# Patient Record
Sex: Female | Born: 1975 | Race: Black or African American | Hispanic: No | Marital: Married | Smoking: Never smoker
Health system: Southern US, Community
[De-identification: ages and names within clinical notes are randomized; demographics above are authoritative.]

## PROBLEM LIST (undated history)

## (undated) DIAGNOSIS — D649 Anemia, unspecified: Secondary | ICD-10-CM

## (undated) DIAGNOSIS — T7840XA Allergy, unspecified, initial encounter: Secondary | ICD-10-CM

## (undated) DIAGNOSIS — D219 Benign neoplasm of connective and other soft tissue, unspecified: Secondary | ICD-10-CM

## (undated) DIAGNOSIS — M1990 Unspecified osteoarthritis, unspecified site: Secondary | ICD-10-CM

## (undated) DIAGNOSIS — I10 Essential (primary) hypertension: Secondary | ICD-10-CM

## (undated) DIAGNOSIS — E119 Type 2 diabetes mellitus without complications: Secondary | ICD-10-CM

## (undated) HISTORY — PX: BREAST LUMPECTOMY: SHX2

## (undated) HISTORY — DX: Allergy, unspecified, initial encounter: T78.40XA

## (undated) HISTORY — PX: WISDOM TOOTH EXTRACTION: SHX21

## (undated) HISTORY — DX: Anemia, unspecified: D64.9

---

## 1998-08-24 ENCOUNTER — Encounter: Payer: Self-pay | Admitting: *Deleted

## 1998-08-24 ENCOUNTER — Ambulatory Visit (HOSPITAL_COMMUNITY): Admission: RE | Admit: 1998-08-24 | Discharge: 1998-08-24 | Payer: Self-pay | Admitting: *Deleted

## 1999-02-20 ENCOUNTER — Emergency Department (HOSPITAL_COMMUNITY): Admission: EM | Admit: 1999-02-20 | Discharge: 1999-02-20 | Payer: Self-pay | Admitting: Emergency Medicine

## 1999-04-14 ENCOUNTER — Ambulatory Visit (HOSPITAL_COMMUNITY): Admission: RE | Admit: 1999-04-14 | Discharge: 1999-04-14 | Payer: Self-pay | Admitting: *Deleted

## 1999-04-14 ENCOUNTER — Encounter: Payer: Self-pay | Admitting: *Deleted

## 2000-06-27 ENCOUNTER — Other Ambulatory Visit: Admission: RE | Admit: 2000-06-27 | Discharge: 2000-06-27 | Payer: Self-pay | Admitting: Obstetrics and Gynecology

## 2001-06-03 ENCOUNTER — Emergency Department (HOSPITAL_COMMUNITY): Admission: EM | Admit: 2001-06-03 | Discharge: 2001-06-04 | Payer: Self-pay | Admitting: Emergency Medicine

## 2002-03-25 ENCOUNTER — Other Ambulatory Visit: Admission: RE | Admit: 2002-03-25 | Discharge: 2002-03-25 | Payer: Self-pay | Admitting: Obstetrics and Gynecology

## 2003-09-08 ENCOUNTER — Other Ambulatory Visit: Admission: RE | Admit: 2003-09-08 | Discharge: 2003-09-08 | Payer: Self-pay | Admitting: Obstetrics and Gynecology

## 2005-03-21 ENCOUNTER — Emergency Department (HOSPITAL_COMMUNITY): Admission: EM | Admit: 2005-03-21 | Discharge: 2005-03-21 | Payer: Self-pay | Admitting: Family Medicine

## 2008-07-13 ENCOUNTER — Inpatient Hospital Stay (HOSPITAL_COMMUNITY): Admission: AD | Admit: 2008-07-13 | Discharge: 2008-07-13 | Payer: Self-pay | Admitting: Obstetrics & Gynecology

## 2008-07-27 ENCOUNTER — Inpatient Hospital Stay (HOSPITAL_COMMUNITY): Admission: AD | Admit: 2008-07-27 | Discharge: 2008-08-05 | Payer: Self-pay | Admitting: Obstetrics

## 2008-07-27 ENCOUNTER — Encounter: Payer: Self-pay | Admitting: Obstetrics

## 2008-07-28 ENCOUNTER — Encounter: Payer: Self-pay | Admitting: Obstetrics

## 2008-07-29 ENCOUNTER — Encounter: Payer: Self-pay | Admitting: Obstetrics

## 2008-07-30 ENCOUNTER — Encounter: Payer: Self-pay | Admitting: Obstetrics & Gynecology

## 2009-12-10 ENCOUNTER — Inpatient Hospital Stay (HOSPITAL_COMMUNITY): Admission: AD | Admit: 2009-12-10 | Discharge: 2009-12-10 | Payer: Self-pay | Admitting: Obstetrics & Gynecology

## 2009-12-10 ENCOUNTER — Ambulatory Visit: Payer: Self-pay | Admitting: Obstetrics & Gynecology

## 2010-02-21 ENCOUNTER — Encounter: Payer: Self-pay | Admitting: Obstetrics

## 2010-03-23 ENCOUNTER — Encounter: Payer: BC Managed Care – PPO | Attending: Obstetrics and Gynecology

## 2010-03-23 DIAGNOSIS — O99810 Abnormal glucose complicating pregnancy: Secondary | ICD-10-CM | POA: Insufficient documentation

## 2010-03-23 DIAGNOSIS — Z713 Dietary counseling and surveillance: Secondary | ICD-10-CM | POA: Insufficient documentation

## 2010-04-12 LAB — CBC
MCH: 31 pg (ref 26.0–34.0)
Platelets: 273 10*3/uL (ref 150–400)
RBC: 3.47 MIL/uL — ABNORMAL LOW (ref 3.87–5.11)
WBC: 13.2 10*3/uL — ABNORMAL HIGH (ref 4.0–10.5)

## 2010-04-12 LAB — URINE CULTURE: Colony Count: 50000

## 2010-04-12 LAB — URINALYSIS, ROUTINE W REFLEX MICROSCOPIC
Bilirubin Urine: NEGATIVE
Ketones, ur: 15 mg/dL — AB
Nitrite: NEGATIVE
Protein, ur: NEGATIVE mg/dL
Specific Gravity, Urine: >1.03 — ABNORMAL HIGH (ref 1.005–1.030)
Urobilinogen, UA: 0.2 mg/dL (ref 0.0–1.0)

## 2010-04-12 LAB — POCT PREGNANCY, URINE: Preg Test, Ur: POSITIVE

## 2010-04-12 LAB — WET PREP, GENITAL: Yeast Wet Prep HPF POC: NONE SEEN

## 2010-04-12 LAB — GC/CHLAMYDIA PROBE AMP, GENITAL
Chlamydia, DNA Probe: NEGATIVE
GC Probe Amp, Genital: NEGATIVE

## 2010-04-12 LAB — HCG, QUANTITATIVE, PREGNANCY: hCG, Beta Chain, Quant, S: 38230 m[IU]/mL — ABNORMAL HIGH (ref <5)

## 2010-04-12 LAB — ABO/RH: ABO/RH(D): B POS

## 2010-05-08 LAB — SAMPLE TO BLOOD BANK

## 2010-05-08 LAB — CBC
MCHC: 35.2 g/dL (ref 30.0–36.0)
RBC: 2.97 MIL/uL — ABNORMAL LOW (ref 3.87–5.11)
RDW: 14.4 % (ref 11.5–15.5)

## 2010-05-09 LAB — CBC
HCT: 32.5 % — ABNORMAL LOW (ref 36.0–46.0)
HCT: 31.1 % — ABNORMAL LOW (ref 36.0–46.0)
Hemoglobin: 10.6 g/dL — ABNORMAL LOW (ref 12.0–15.0)
Hemoglobin: 10.9 g/dL — ABNORMAL LOW (ref 12.0–15.0)
MCHC: 34.8 g/dL (ref 30.0–36.0)
MCV: 99.7 fL (ref 78.0–100.0)
MCV: 99.5 fL (ref 78.0–100.0)
Platelets: 201 10*3/uL (ref 150–400)
Platelets: 216 10*3/uL (ref 150–400)
Platelets: 236 10*3/uL (ref 150–400)
RDW: 14.2 % (ref 11.5–15.5)
RDW: 14.4 % (ref 11.5–15.5)
RDW: 14.2 % (ref 11.5–15.5)

## 2010-05-09 LAB — COMPREHENSIVE METABOLIC PANEL
ALT: 18 U/L (ref 0–35)
AST: 28 U/L (ref 0–37)
Albumin: 2.6 g/dL — ABNORMAL LOW (ref 3.5–5.2)
Albumin: 2.7 g/dL — ABNORMAL LOW (ref 3.5–5.2)
Albumin: 2.7 g/dL — ABNORMAL LOW (ref 3.5–5.2)
Alkaline Phosphatase: 115 U/L (ref 39–117)
BUN: 7 mg/dL (ref 6–23)
BUN: 3 mg/dL — ABNORMAL LOW (ref 6–23)
Calcium: 8.7 mg/dL (ref 8.4–10.5)
Chloride: 109 mEq/L (ref 96–112)
Creatinine, Ser: 0.5 mg/dL (ref 0.4–1.2)
Creatinine, Ser: 0.5 mg/dL (ref 0.4–1.2)
Glucose, Bld: 120 mg/dL — ABNORMAL HIGH (ref 70–99)
Glucose, Bld: 133 mg/dL — ABNORMAL HIGH (ref 70–99)
Potassium: 3.6 mEq/L (ref 3.5–5.1)
Sodium: 133 mEq/L — ABNORMAL LOW (ref 135–145)
Total Bilirubin: 0.6 mg/dL (ref 0.3–1.2)
Total Protein: 6.2 g/dL (ref 6.0–8.3)
Total Protein: 6.4 g/dL (ref 6.0–8.3)
Total Protein: 6.4 g/dL (ref 6.0–8.3)

## 2010-05-09 LAB — LACTATE DEHYDROGENASE
LDH: 121 U/L (ref 94–250)
LDH: 128 U/L (ref 94–250)

## 2010-05-09 LAB — URINALYSIS, ROUTINE W REFLEX MICROSCOPIC
Ketones, ur: NEGATIVE mg/dL
Nitrite: NEGATIVE
Urobilinogen, UA: 0.2 mg/dL (ref 0.0–1.0)
pH: 7 (ref 5.0–8.0)

## 2010-05-09 LAB — DIFFERENTIAL
Basophils Relative: 0 % (ref 0–1)
Eosinophils Absolute: 0.1 10*3/uL (ref 0.0–0.7)
Monocytes Absolute: 0.5 10*3/uL (ref 0.1–1.0)
Monocytes Relative: 4 % (ref 3–12)

## 2010-05-09 LAB — URINE MICROSCOPIC-ADD ON

## 2010-05-09 LAB — URIC ACID
Uric Acid, Serum: 5.5 mg/dL (ref 2.4–7.0)
Uric Acid, Serum: 5 mg/dL (ref 2.4–7.0)

## 2010-05-30 ENCOUNTER — Encounter (HOSPITAL_COMMUNITY)
Admission: RE | Admit: 2010-05-30 | Discharge: 2010-05-30 | Disposition: A | Discharge status: Home / Self Care | Payer: BC Managed Care – PPO | Source: Ambulatory Visit | Attending: Obstetrics and Gynecology | Admitting: Obstetrics and Gynecology

## 2010-05-30 LAB — SURGICAL PCR SCREEN: MRSA, PCR: NEGATIVE

## 2010-05-30 LAB — CBC
HCT: 31.5 % — ABNORMAL LOW (ref 36.0–46.0)
Hemoglobin: 10.6 g/dL — ABNORMAL LOW (ref 12.0–15.0)
MCHC: 33.7 g/dL (ref 30.0–36.0)
WBC: 10.9 10*3/uL — ABNORMAL HIGH (ref 4.0–10.5)

## 2010-05-30 LAB — BASIC METABOLIC PANEL
CO2: 20 mEq/L (ref 19–32)
Calcium: 8.8 mg/dL (ref 8.4–10.5)
GFR calc Af Amer: >60 mL/min (ref >60)
GFR calc non Af Amer: >60 mL/min (ref >60)
Glucose, Bld: 81 mg/dL (ref 70–99)
Potassium: 3.4 mEq/L — ABNORMAL LOW (ref 3.5–5.1)
Sodium: 138 mEq/L (ref 135–145)

## 2010-06-06 ENCOUNTER — Inpatient Hospital Stay (HOSPITAL_COMMUNITY)
Admission: AD | Admit: 2010-06-06 | Discharge: 2010-06-10 | DRG: 371 | Disposition: A | Discharge status: Home / Self Care | Payer: BC Managed Care – PPO | Source: Ambulatory Visit | Attending: Obstetrics and Gynecology | Admitting: Obstetrics and Gynecology

## 2010-06-06 DIAGNOSIS — O34599 Maternal care for other abnormalities of gravid uterus, unspecified trimester: Secondary | ICD-10-CM | POA: Diagnosis present

## 2010-06-06 DIAGNOSIS — Z01818 Encounter for other preprocedural examination: Secondary | ICD-10-CM

## 2010-06-06 DIAGNOSIS — D4959 Neoplasm of unspecified behavior of other genitourinary organ: Secondary | ICD-10-CM | POA: Diagnosis present

## 2010-06-06 DIAGNOSIS — Z01812 Encounter for preprocedural laboratory examination: Secondary | ICD-10-CM

## 2010-06-06 DIAGNOSIS — O34219 Maternal care for unspecified type scar from previous cesarean delivery: Principal | ICD-10-CM | POA: Diagnosis present

## 2010-06-06 DIAGNOSIS — O99814 Abnormal glucose complicating childbirth: Secondary | ICD-10-CM | POA: Diagnosis present

## 2010-06-06 DIAGNOSIS — D251 Intramural leiomyoma of uterus: Secondary | ICD-10-CM | POA: Diagnosis present

## 2010-06-06 LAB — TYPE AND SCREEN: Antibody Screen: NEGATIVE

## 2010-06-06 LAB — GLUCOSE, CAPILLARY

## 2010-06-07 LAB — CBC
HCT: 27 % — ABNORMAL LOW (ref 36.0–46.0)
Hemoglobin: 8.8 g/dL — ABNORMAL LOW (ref 12.0–15.0)
RDW: 15.3 % (ref 11.5–15.5)
WBC: 11 10*3/uL — ABNORMAL HIGH (ref 4.0–10.5)

## 2010-06-08 LAB — GLUCOSE, CAPILLARY: Glucose-Capillary: 104 mg/dL — ABNORMAL HIGH (ref 70–99)

## 2010-06-09 LAB — CBC
MCV: 93 fL (ref 78.0–100.0)
Platelets: 216 10*3/uL (ref 150–400)
RBC: 2.87 MIL/uL — ABNORMAL LOW (ref 3.87–5.11)
WBC: 8.1 10*3/uL (ref 4.0–10.5)

## 2010-06-09 LAB — COMPREHENSIVE METABOLIC PANEL
ALT: 13 U/L (ref 0–35)
AST: 19 U/L (ref 0–37)
Albumin: 2.4 g/dL — ABNORMAL LOW (ref 3.5–5.2)
Alkaline Phosphatase: 93 U/L (ref 39–117)
BUN: 6 mg/dL (ref 6–23)
Chloride: 104 mEq/L (ref 96–112)
Potassium: 3.7 mEq/L (ref 3.5–5.1)
Sodium: 137 mEq/L (ref 135–145)
Total Bilirubin: 0.3 mg/dL (ref 0.3–1.2)

## 2010-06-09 LAB — URIC ACID: Uric Acid, Serum: 4.7 mg/dL (ref 2.4–7.0)

## 2010-06-11 ENCOUNTER — Inpatient Hospital Stay (HOSPITAL_COMMUNITY)
Admission: AD | Admit: 2010-06-11 | Payer: BC Managed Care – PPO | Source: Ambulatory Visit | Admitting: Obstetrics & Gynecology

## 2010-06-14 NOTE — Op Note (Signed)
Sierra King, Sierra King            ACCOUNT NO.:  [id number]   MEDICAL RECORD NO.:  [id number]          PATIENT TYPE:  OUT   LOCATION:  MFM                           [facility]  [facility]   PHYSICIAN:  Roseanna Rainbow, M.D.DATE OF BIRTH:  [birth year]   DATE OF PROCEDURE:  07/30/2008  DATE OF DISCHARGE:  07/30/2008                               OPERATIVE REPORT   PREOPERATIVE DIAGNOSES:  1. Intrauterine pregnancy at 33 plus weeks.  2. Pregnancy-induced hypertension.  3. Intrauterine growth restriction.  4. Suspicious fetal heart tracing with decelerations.   POSTOPERATIVE DIAGNOSES:  1. Intrauterine pregnancy at 33 plus weeks.  2. Pregnancy-induced hypertension.  3. Intrauterine growth restriction.  4. Suspicious fetal heart tracing with decelerations.   PROCEDURE:  Repeat cesarean delivery via transverse skin incision.   SURGEON:  Roseanna Rainbow, MD   ANESTHESIA:  Spinal.   FINDINGS:  Live born female, Apgars 6 at 1 minute and 8 at 5 minutes,  cephalic presentation, umbilical artery pH 7.29.  There was a true knot  in the umbilical cord and a loose body cord.   PATHOLOGY:  Placenta.   ESTIMATED BLOOD LOSS:  600 mL.   COMPLICATIONS:  None.   PROCEDURE:  This patient was taken to the operating room with an IV  running.  Spinal anesthetic was administered.  She was then placed in  the dorsal supine position with a leftward tilt.  She was prepped and  draped in the usual sterile fashion.  After a time-out had been  completed, the previous transverse scar was then incised with a scalpel,  and the incision was carried down to the underlying fascia.  The fascia  was incised along the length of the incision.  The superior aspect of  the fascial incision was tented up and the underlying rectus muscles  dissected off.  The inferior aspect of the fascial incision was  manipulated in a similar fashion.  The rectus muscles were separated in  the midline.  The parietal  peritoneum was tented up and entered sharply.  This incision was then extended superiorly and inferiorly with good  vision visualization of the bladder.  The bladder blade was then placed.  The vesicouterine peritoneum was tented up and entered sharply.  This  incision was then extended bilaterally, and a bladder flap created  sharply.  The bladder blade was then replaced.  The lower uterine  segment was incised in a transverse fashion with a scalpel.  The  incision was then extended bluntly.  An attempt was made to deliver the  infant en caul.  The infant's head was delivered atraumatically.  The  cord was clamped and cut.  The infant was handed off to the awaiting  neonatologist.  The placenta was then removed.  Please note that an  umbilical artery pH was sent.  The intrauterine cavity was then  evacuated of any remaining amniotic fluid clots and debris with  moistened laparotomy sponge.  The uterine incision was then  reapproximated in a running interlocking fashion using a suture of 0  Monocryl.  A second imbricating layer of the same suture  was placed.  Adequate hemostasis was noted.  The paracolic gutters were then  irrigated.  The parietal peritoneum was then reapproximated in a running  fashion using a suture of 2-0 Vicryl.  The fascia was reapproximated  with 2 running sutures of 0 PDS tied in the midline.  The skin was  closed in a subcuticular fashion using a suture of 3-0 Monocryl.  At the  close of the procedure, the instrument and pack counts were said to be  correct x2.  The patient was taken to the PACU awake and in stable  condition.      Roseanna Rainbow, M.D.  Electronically Signed     LAJ/MEDQ  D:  07/30/2008  T:  07/31/2008  Job:  [id number]

## 2010-06-17 NOTE — Discharge Summary (Signed)
Sierra King, Sierra King            ACCOUNT NO.:  [id number]   MEDICAL RECORD NO.:  [id number]          PATIENT TYPE:  INP   LOCATION:  9303                          [facility]:  [facility]   PHYSICIAN:  Roseanna Rainbow, M.D.DATE OF BIRTH:  March 17, 1975   DATE OF ADMISSION:  07/27/2008  DATE OF DISCHARGE:  08/05/2008                               DISCHARGE SUMMARY   CHIEF COMPLAINT:  The patient is a 35 year old, gravida 4, para 1 with  an estimated date of confinement of September 03, 2008, with elevated blood  pressures.   HISTORY OF PRESENT ILLNESS:  The patient has a history of elevated blood  pressures over the last several weeks.  The patient was recently started  on Aldomet.  She was found to have a blood pressure of 160/110 at a  routine office visit on the day of presentation.   PAST SURGICAL HISTORY:  Breast lumpectomy, oral surgery, and cesarean  delivery.   PAST MEDICAL HISTORY:  Osteoarthritis, migraine headaches, and genital  herpes.   MEDICATIONS:  Prenatal vitamins.   ALLERGIES:  No known drug allergies.   SOCIAL HISTORY:  She is married.  She denies any tobacco, ethanol, or  drug use.   FAMILY HISTORY:  Diabetes mellitus and chronic hypertension.   REVIEW OF SYSTEMS:  NEUROLOGIC:  She denies any headaches or visual  disturbances.   PHYSICAL EXAMINATION:  VITAL SIGNS:  Temperature 98.5 and blood pressure  160/110.  LUNGS:  Clear to auscultation bilaterally.  HEART:  Regular rate and rhythm.  ABDOMEN:  Gravid and nontender.   ASSESSMENT AND PLAN:  Intrauterine pregnancy at 33 plus weeks with  pregnancy-induced hypertension.  Planned admission.  Heightened  surveillance.   HOSPITAL COURSE:  The patient was admitted.  Dr. Margot Ables at the [facility] was consulted.  An ultrasound showed an estimated fetal  weight at less than 10th percentile.  His recommendations included  performing a 24-hour urine for protein and creatinine, to complete a  course of  betamethasone, obtain serial PIH labs, perform serial  umbilical artery Doppler assessments, and biweekly fetal testing.  The  patient received a course of betamethasone.  A 24-hour urine showed a  total protein of 252 mg per day and a creatinine clearance of 196 mL per  minute.  On July 28, 2008, [facility] panel was normal.  On July 28, 2008, a  BPP was 8/8.  On July 29, 2008, until July 30, 2008, the fetal heart  tracing deteriorated with decreased long term variability and  spontaneous decelerations.  At this point, the magnesium sulfate was  initiated for seizure prophylaxis.  Intrauterine resuscitation efforts  were performed and the patient was brought for a cesarean delivery.  Please see the dictated operative summary.  Postoperatively, the patient  was admitted to the AICU and the magnesium sulfate seizure prophylaxis  was continued.  She had previously been on Aldomet for her blood  pressure control and Norvasc was added to this regimen.  On  postoperative day #1, the hemoglobin was 7.9 and a PIH labs panel was  normal.  She remained stable and on  postoperative day 2, the magnesium  sulfate was discontinued.  Subsequently, she developed labile blood  pressures and labetalol was added to her blood pressure regimen.  She  remained stable and was discharged to home on postoperative day #6.   DISCHARGE DIAGNOSIS:  Severe preeclampsia at 33 weeks, suspicious FHT  with spontaneous decelerations   PROCEDURE:  Cesarean delivery.   CONDITION:  Stable.   DIET:  Regular.   ACTIVITY:  Pelvic rest progressive activity.   MEDICATIONS:  1. Percocet 1-2 tablets every 6 hours as needed.  2. Norvasc 5 mg once per day.  3. Aldomet 500 mg p.o. t.i.d.   DISPOSITION:  The patient was to follow up in the office in several  days.      Roseanna Rainbow, M.D.  Electronically Signed     LAJ/MEDQ  D:  08/14/2008  T:  08/15/2008  Job:  [id number]

## 2010-06-18 NOTE — Op Note (Signed)
Sierra King, LEVAY            ACCOUNT NO.:  [id number]  MEDICAL RECORD NO.:  [id number]           PATIENT TYPE:  I  LOCATION:  9126                          [facility]  [facility]  PHYSICIAN:  Maxie Better, M.D.DATE OF BIRTH:  [birth year]  DATE OF PROCEDURE:  06/06/2010 DATE OF DISCHARGE:                              OPERATIVE REPORT   PREOPERATIVE DIAGNOSES:  Previous cesarean section x3, term gestation, class A2 gestational diabetes, uterine fibroids.  POSTOPERATIVE DIAGNOSES:  Previous cesarean section x3, term gestation, uterine fibroids, class A2 gestational diabetes.  PROCEDURES:  Repeat cesarean section, Kerr hysterotomy.  ANESTHESIA:  Spinal.  SURGEON:  Maxie Better, MD  ASSISTANT:  Marlinda Mike, CNM  PROCEDURE IN DETAILS:  Under adequate spinal anesthesia, the patient was placed in a supine position with a left lateral tilt.  She was sterilely prepped and draped in usual fashion and indwelling Foley catheter was sterilely placed.  Marcaine 0.25% was injected along the previous Pfannenstiel skin incision.  Pfannenstiel skin incision was then made, carried down to the rectus fascia.  Rectus fascia was opened transversely.  The rectus fascia was then bluntly and sharply dissected off the rectus muscle in superior and inferior fashion.  The rectus muscles was split in midline.  The parietal peritoneum was entered bluntly and extended.  On entering the abdominal cavity, a very thin lower uterine segment was encountered with a quarter size window noted on the right which was still covered by a very thin layer of the uterine serosa.  One incision was made resulting in the serosa of the uterus opening and extended with bandage scissors, the bladder which had been adherent to the lower uterine segment was not touched.  The incision was extended.  The artificial rupture of membranes was done.  Clear amniotic fluid was noted.  A floating vertex was noted and inability  to deliver the baby without assistance.  Vacuum was applied with subsequent delivery of a live female who was bulb suctioned in the abdomen.  Cord was clamped and cut.  The baby was transferred to the awaiting pediatrician who assigned Apgars of 9 and 9 at 1 and 5 minutes.  The patient's placenta was posterior, was spontaneously removed.  Uterine cavity was cleaned of debris.  Uterine incision had a small extension inferiorly about a centimeter in the midline.  That was closed separately with 0 Monocryl figure-of-eight sutures and the incision was then closed with 0 Monocryl running locked suture.  A second layer could not be placed due to risk again still send this below that lower uterine segment with inability to reinforce that incision without probable tearing through what tissue was already in place at that point.  A decision was then made with good hemostasis already present not to further try to do that.  The tubes and ovaries were noted to be normal bilaterally.  There was a right intramural cornual fibroid noted and 1 intramural fibroid.  The abdomen was copiously irrigated and suctioned of debris.  The parietal peritoneum was then closed with 2-0 Vicryl. The rectus fascia was closed with 0 Vicryl x2.  The subcutaneous area was irrigated.  Small  bleeders cauterized.  Interrupted 2-0 plain suture was placed and the skin approximated with Ethicon staples.  SPECIMENS:  Placenta not sent to Pathology.  Estimated blood loss 600 mL.  Intraoperative fluid 1900 mL.  Urine output 50 mL.  Sponge and instrument counts x2 was correct. Complications was none.  Weight of the baby was 7 pounds 4 ounces.  The patient tolerated the procedure well and was transferred to recovery room in stable condition.     Maxie Better, M.D.     [place]/MEDQ  D:  06/06/2010  T:  06/07/2010  Job:  [id number]  Electronically Signed by Nena Jordan Saaya Procell M.D. on 06/18/2010 06:47:15 PM

## 2010-06-18 NOTE — Discharge Summary (Addendum)
NAMEAVILA, ALBRITTON            ACCOUNT NO.:  [id number]  MEDICAL RECORD NO.:  [id number]           PATIENT TYPE:  I  LOCATION:  9126                          [facility]  PHYSICIAN:  Maxie Better, M.D.DATE OF BIRTH:  Nov 23, 1975  DATE OF ADMISSION:  06/06/2010 DATE OF DISCHARGE:  06/10/2010                              DISCHARGE SUMMARY   ADMITTING DIAGNOSES: 1. Term intrauterine pregnancy. 2. History of previous C-section. 3. History of uterine fibroids. 4. Gestational diabetes, class A2.  DISCHARGE DIAGNOSES: 1. Status post C-section and delivery of a viable female infant. 2. Resolved gestational diabetes. 3. Uterine fibroids. 4. Labile blood pressures with no evidence of preeclampsia. 5. Acute blood loss anemia.  The patient is a G5, P 2-1-2-2 at term gestation with an [facility] of Jun 11, 2010.  The patient has presented late to [facility] for prenatal care at 17 weeks with Dr. Cherly Hensen as primary provider.  PRENATAL LABORATORY DATA:  Rh positive, rubella immune, GBS positive, HIV nonreactive, RPR nonreactive, hepatitis B nonreactive, GTT abnormal at 180.  Prenatal course has been complicated by gestational diabetes class A2 and the patient has been on glyburide 2.5 mg p.o. b.i.d.  The patient also has medical history of obesity and previous pregnancy history to include preeclampsia.  ALLERGIES:  Known LATEX allergy.  CURRENT MEDICATIONS:  Iron supplement p.o. daily and prenatal vitamins 1 p.o. daily along with glyburide 2.5 mg b.i.d.  Preop labs were obtained on May 30, 2010.  WBC 10.9, hemoglobin 10.6, hematocrit 31.5, and platelets 225.  The patient was planned for C- section on Jun 06, 2010, due to previous C-section x3 and at term gestation along with gestational diabetes.  C-section was performed on Jun 06, 2010, by Dr. Cherly Hensen delivering a viable female infant weighing 7 pounds and 4 ounces and Apgars 9 and 9 at 1 and 5 minutes.  Infant  was transferred to [facility] in stable condition.  Postop course has been complicated by elevated blood pressures in the range of 160-170s/90- 101.  Lab values have been stable.  Uric acid 4.7 on Jun 09, 2010.  LDH 195 on Jun 09, 2010, and liver enzymes were within normal limits on Jun 09, 2010.  The patient has also been noted to be anemic.  Postoperative lab work also included on Jun 07, 2010.  WBC 11.0, hemoglobin 8.8, hematocrit 27.0, and platelets 212.  The patient was placed on labetalol 100 mg p.o. b.i.d. and was also supplemented with IV doses of labetalol, was also given hydrochlorothiazide 50 mg p.o. daily.  At the time of discharge, blood pressures had stabilized in the range of 150s-160s/90s without any symptoms of preeclampsia denying headache, changes in vision and epigastric pain.  Lower extremity edema has also improved and at the time of discharge has trace to +1 pedal edema with a negative Homans sign.  The patient is up in the room tolerating a regular diet and voiding without problems and positive for flatus.  Low-transverse incision is closed with staples.  There is a moderate amount of edema and due to repeat C-section status we will plan to remove staples on day #  7.  The patient has appointment in the office for Jun 13, 2010, at 11:00 a.m. for staple removal and blood pressure check.  The patient has been given a [employer] [facility] postpartum booklet.  Medications at the time of discharge include: 1. Ibuprofen 600 mg p.o. q.6 h. p.r.n. pain, dispensed #30, no refill. 2. Percocet 5/325 one to two p.o. q.6 h. p.r.n. pain, #30, no refill. 3. Labetalol 100 mg p.o. b.i.d., #60, no refill. 4. Hydrochlorothiazide 50 mg 1 p.o. daily x4 days, dispensed #4, no     refill. 5. Ferralet 90 one p.o. daily, #30. 6. Colace 100 mg p.o. b.i.d., #30.  The patient has also been advised to follow up for routine postpartum visit in 6 weeks.     Arlana Lindau,  NP   ______________________________ Maxie Better, M.D.    JF/MEDQ  D:  06/10/2010  T:  06/10/2010  Job:  [id number]  Electronically Signed by Nena Jordan COUSINS M.D. on 06/18/2010 06:45:52 PM Electronically Signed by Arlana Lindau  on 06/29/2010 05:04:21 PM

## 2012-01-10 ENCOUNTER — Other Ambulatory Visit: Payer: Self-pay | Admitting: Family Medicine

## 2012-01-10 ENCOUNTER — Ambulatory Visit
Admission: RE | Admit: 2012-01-10 | Discharge: 2012-01-10 | Disposition: A | Discharge status: Home / Self Care | Payer: BC Managed Care – PPO | Source: Ambulatory Visit | Attending: Family Medicine | Admitting: Family Medicine

## 2012-01-10 DIAGNOSIS — R109 Unspecified abdominal pain: Secondary | ICD-10-CM

## 2012-01-10 MED ORDER — IOHEXOL 300 MG/ML  SOLN
125.0000 mL | Freq: Once | INTRAMUSCULAR | Status: AC | PRN
  Administered 2012-01-10: 125 mL via INTRAVENOUS

## 2012-01-12 ENCOUNTER — Other Ambulatory Visit: Payer: BC Managed Care – PPO

## 2012-09-04 ENCOUNTER — Ambulatory Visit: Payer: BC Managed Care – PPO

## 2012-09-04 ENCOUNTER — Ambulatory Visit (INDEPENDENT_AMBULATORY_CARE_PROVIDER_SITE_OTHER): Payer: BC Managed Care – PPO | Admitting: Family Medicine

## 2012-09-04 VITALS — BP 98/64 | HR 87 | Temp 98.1°F | Resp 18 | Ht 61.0 in | Wt 213.0 lb

## 2012-09-04 DIAGNOSIS — S83412A Sprain of medial collateral ligament of left knee, initial encounter: Secondary | ICD-10-CM

## 2012-09-04 DIAGNOSIS — M25562 Pain in left knee: Secondary | ICD-10-CM

## 2012-09-04 DIAGNOSIS — M25569 Pain in unspecified knee: Secondary | ICD-10-CM

## 2012-09-04 DIAGNOSIS — S83419A Sprain of medial collateral ligament of unspecified knee, initial encounter: Secondary | ICD-10-CM

## 2012-09-04 NOTE — Patient Instructions (Signed)
I suspect you have an MCL sprain, but also possible meniscus tear.  An MRI would better determine this, but can try brace, exercises as in handout, ibuprofen or alleve over the counter if needed, and recheck in next 10-14 days.  Return to the clinic or go to the nearest emergency room if any of your symptoms worsen or new symptoms occur, as may need MRI or to see ortho.   Knee Sprain A knee sprain is a tear in one of the strong, fibrous tissues that connect the bones (ligaments) in your knee. The severity of the sprain depends on how much of the ligament is torn. The tear can be either partial or complete. CAUSES  Often, sprains are a result of a fall or injury. The force of the impact causes the fibers of your ligament to stretch too much. This excess tension causes the fibers of your ligament to tear. SYMPTOMS  You may have some loss of motion in your knee. Other symptoms include:  Bruising.  Tenderness.  Swelling. DIAGNOSIS  In order to diagnose knee sprain, your caregiver will physically examine your knee to determine how torn the ligament is. Your caregiver may also suggest an X-ray exam of your knee to make sure no bones are broken. TREATMENT  If your ligament is only partially torn, treatment usually involves keeping the knee in a fixed position (immobilization) or bracing your knee for activities that require movement for several weeks. To do this, your caregiver will apply a bandage, cast, or splint to keep your knee from moving or support your knee during movement until it heals. For a partially torn ligament, the healing process usually takes 4 to 6 weeks. If your ligament is completely torn, depending on which ligament it is, you may need surgery to reconnect the ligament to the bone or reconstruct it. After surgery, a cast or splint may be applied and will need to stay on your knee for 4 to 6 weeks while your ligament heals. HOME CARE INSTRUCTIONS  Keep your injured knee elevated to  decrease swelling.  To ease pain and swelling, apply ice to your knee twice a day, for 2 to 3 days:  Put ice in a plastic bag.  Place a towel between your skin and the bag.  Leave the ice on for 15 minutes.  Only take over-the-counter or prescription medicine for pain as directed by your caregiver.  Do not leave your knee unprotected until pain and stiffness go away (usually 4 to 6 weeks).  Do not allow your cast or splint to get wet. If you have been instructed not to remove it, cover your cast or splint with a plastic bag when you shower or bathe. Do not swim.  Your caregiver may suggest exercises for you to do during your recovery to prevent or limit permanent weakness and stiffness. SEEK IMMEDIATE MEDICAL CARE IF:  Your cast or splint becomes damaged.  Your pain becomes worse. MAKE SURE YOU:  Understand these instructions.  Will watch your condition.  Will get help right away if you are not doing well or get worse. Document Released: 01/16/2005 Document Revised: 04/10/2011 Document Reviewed: 12/31/2010 [facility] Patient Information 2014 [place], [place].

## 2012-09-04 NOTE — Progress Notes (Signed)
Subjective:    Patient ID: Sierra King, female    DOB: [birth year], 37 y.o.   MRN: [id number]  HPI Sierra King is a 37 y.o. female  Larey Seat down steps 2 weeks ago - had on slippers - R foot slid, L leg bent in - felt pop on inside of knee. Pain to WB initially - tx: ice, massage, few tylenol.  able to WB next day, applied ace wrap. Rom, trying to keep moving, but still sore on inside and feels popping and giving way sensation on inside of knee. No prior knee injury/surgery.   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 Nonsmoker.    Review of Systems  Musculoskeletal: Positive for joint swelling and arthralgias.  Skin: Negative for color change, rash and wound.       Objective:   Physical Exam  Vitals reviewed. Constitutional: She is oriented to person, place, and time. She appears well-nourished. No distress.  Overweight.   Pulmonary/Chest: Effort normal.  Musculoskeletal:       Left knee: She exhibits decreased range of motion (minimal - apporx 5-10 degree loss of terminal flexion. ), effusion (1+), abnormal meniscus (pain with mcmurray medially. ) and MCL laxity. She exhibits no erythema, no LCL laxity and normal patellar mobility. Tenderness found. Medial joint line and MCL tenderness noted. No patellar tendon tenderness noted.       Legs: Neurological: She is alert and oriented to person, place, and time.  nvi distally.     [facility] reading (PRIMARY) by  Dr. 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      Assessment & Plan:  Sierra King is a 37 y.o. female Knee pain, acute, left - Plan: DG Knee Complete 4 Views Left  Knee MCL sprain, left, initial encounter  Suspected L MCL tear with possible coexistent medial meniscus injury.  Options discussed incl. MRI or ortho eval vs trial of hinged knee brace  Will try hinged knee brace, otc alleve or ibuprofen, and HEP per sports medicine database handout, with  recheck in next 2 weeks. rtc precautions.   Patient Instructions  I suspect you have an MCL sprain, but also possible meniscus tear.  An MRI would better determine this, but can try brace, exercises as in handout, ibuprofen or alleve over the counter if needed, and recheck in next 10-14 days.  Return to the clinic or go to the nearest emergency room if any of your symptoms worsen or new symptoms occur, as may need MRI or to see ortho.   Knee Sprain A knee sprain is a tear in one of the strong, fibrous tissues that connect the bones (ligaments) in your knee. The severity of the sprain depends on how much of the ligament is torn. The tear can be either partial or complete. CAUSES  Often, sprains are a result of a fall or injury. The force of the impact causes the fibers of your ligament to stretch too much. This excess tension causes the fibers of your ligament to tear. SYMPTOMS  You may have some loss of motion in your knee. Other symptoms include:  Bruising.  Tenderness.  Swelling. DIAGNOSIS  In order to diagnose knee sprain, your caregiver will physically examine your knee to determine how torn the ligament is. Your caregiver may also suggest an X-ray exam of your knee to make sure no bones are broken. TREATMENT  If your ligament is only partially torn, treatment usually involves keeping  the knee in a fixed position (immobilization) or bracing your knee for activities that require movement for several weeks. To do this, your caregiver will apply a bandage, cast, or splint to keep your knee from moving or support your knee during movement until it heals. For a partially torn ligament, the healing process usually takes 4 to 6 weeks. If your ligament is completely torn, depending on which ligament it is, you may need surgery to reconnect the ligament to the bone or reconstruct it. After surgery, a cast or splint may be applied and will need to stay on your knee for 4 to 6 weeks while your ligament  heals. HOME CARE INSTRUCTIONS  Keep your injured knee elevated to decrease swelling.  To ease pain and swelling, apply ice to your knee twice a day, for 2 to 3 days:  Put ice in a plastic bag.  Place a towel between your skin and the bag.  Leave the ice on for 15 minutes.  Only take over-the-counter or prescription medicine for pain as directed by your caregiver.  Do not leave your knee unprotected until pain and stiffness go away (usually 4 to 6 weeks).  Do not allow your cast or splint to get wet. If you have been instructed not to remove it, cover your cast or splint with a plastic bag when you shower or bathe. Do not swim.  Your caregiver may suggest exercises for you to do during your recovery to prevent or limit permanent weakness and stiffness. SEEK IMMEDIATE MEDICAL CARE IF:  Your cast or splint becomes damaged.  Your pain becomes worse. MAKE SURE YOU:  Understand these instructions.  Will watch your condition.  Will get help right away if you are not doing well or get worse. Document Released: 01/16/2005 Document Revised: 04/10/2011 Document Reviewed: 12/31/2010 [facility] Patient Information 2014 [place], [place].

## 2016-05-15 ENCOUNTER — Other Ambulatory Visit: Payer: Self-pay | Admitting: Obstetrics and Gynecology

## 2016-05-15 DIAGNOSIS — D250 Submucous leiomyoma of uterus: Secondary | ICD-10-CM

## 2016-05-16 ENCOUNTER — Ambulatory Visit
Admission: RE | Admit: 2016-05-16 | Discharge: 2016-05-16 | Disposition: A | Discharge status: Home / Self Care | Payer: Self-pay | Source: Ambulatory Visit | Attending: Obstetrics and Gynecology | Admitting: Obstetrics and Gynecology

## 2016-05-16 DIAGNOSIS — D250 Submucous leiomyoma of uterus: Secondary | ICD-10-CM

## 2016-05-16 HISTORY — PX: IR RADIOLOGIST EVAL & MGMT: IMG5224

## 2016-05-16 NOTE — Consult Note (Signed)
Chief Complaint:  Systematic uterine fibroids, abnormal menstrual bleeding.   Referring Physician(s): Cousins,Sheronette  History of Present Illness: Sierra King is a 41 y.o. female G49P3A1. No future pregnancy plans. She currently is not on birth control. Review of her menstrual cycle performed. Last  cycle 05/04/2016. Menstrual cycle frequency is every 28 days. Cycle length is 8 days with 3 heavy days including passage of blood clots. Frequency of pad changes every 2 hours with inter-periord Bleeding between her cycle. Fibroids were originally diagnosed 5 years ago. Bleeding symptoms include the menorrhagia as well as iron deficiency anemia. She also has bulk related symptoms including urinary urgency, bloating and dyspareunia. She currently is not on any birth control pills or hormone replacement therapies. No surgeries for the fibroids. No recent GYN infections. Ultrasound 05/03/2016 confirms multiple fibroids with an overall uterine length of 11 cm.  Past Medical History:  Diagnosis Date  . Allergy   . Anemia     Past Surgical History:  Procedure Laterality Date  . CESAREAN SECTION      Allergies: Latex  Medications: Prior to Admission medications   Medication Sig Start Date End Date Taking? Authorizing Provider  Amlodipine Besylate-Valsartan (EXFORGE PO) Take by mouth.    Historical Provider, MD  METFORMIN HCL PO Take by mouth.    Historical Provider, MD     Family History  Problem Relation Age of Onset  . Diabetes Mother   . Hypertension Mother   . Diabetes Father   . Hypertension Father     Social History   Social History  . Marital status: Married    Spouse name: N/A  . Number of children: N/A  . Years of education: N/A   Social History Main Topics  . Smoking status: Never Smoker  . Smokeless tobacco: Never Used  . Alcohol use Yes     Comment: occasional glass of wine or beer  . Drug use: Unknown  . Sexual activity: Yes    Partners: Male      Birth control/ protection: None   Other Topics Concern  . None   Social History Narrative  . None     Review of Systems: A 12 point ROS discussed and pertinent positives are indicated in the HPI above.  All other systems are negative.  Review of Systems  Vital Signs: BP 136/86 (BP Location: Left Arm, Patient Position: Sitting, Cuff Size: Normal)   Pulse 86   Temp 98.8 F (37.1 C) (Oral)   Resp 14   Ht 4' 10.75" (1.492 m)   Wt 207 lb (93.9 kg)   LMP 05/04/2016   SpO2 100%   BMI 42.17 kg/m   Physical Exam  Constitutional: She is oriented to person, place, and time. She appears well-developed and well-nourished. No distress.  Moderately obese body habitus.  Eyes: Conjunctivae are normal. No scleral icterus.  Cardiovascular: Normal rate, regular rhythm, normal heart sounds and intact distal pulses.   No murmur heard. Pulmonary/Chest: Effort normal and breath sounds normal. No respiratory distress.  Abdominal: Soft. Bowel sounds are normal. She exhibits no distension.  Musculoskeletal: Normal range of motion. She exhibits no edema.  Neurological: She is alert and oriented to person, place, and time.  Skin: Skin is warm and dry. No rash noted. She is not diaphoretic.  Psychiatric: She has a normal mood and affect. Her behavior is normal.    Mallampati Score:   1  Imaging: No results found.  Labs:  CBC: No  results for input(s): WBC, HGB, HCT, PLT in the last 8760 hours.  COAGS: No results for input(s): INR, APTT in the last 8760 hours.  BMP: No results for input(s): NA, K, CL, CO2, GLUCOSE, BUN, CALCIUM, CREATININE, GFRNONAA, GFRAA in the last 8760 hours.  Invalid input(s): CMP  LIVER FUNCTION TESTS: No results for input(s): BILITOT, AST, ALT, ALKPHOS, PROT, ALBUMIN in the last 8760 hours.   Assessment and Plan:  41 year old female with systematic uterine fibroids resulting in dysfunctional uterine bleeding and iron deficiency anemia. She also has bulk  related symptoms including urinary urgency, bloating and dyspareunia. Treatment options including surgery, hormone replacement therapies, and embolization were reviewed. Our discussion centered around the embolization procedure. The procedure, risks, benefits and alternatives were reviewed. Expected goals, outcomes and recovery also reviewed. All questions were addressed. She has a clear understanding of the procedure. She would like to proceed with the workup.  Plan: Schedule for outpatient pelvic MRI without and with contrast to assess fibroid anatomy for embolization.  Thank you for this interesting consult.  I greatly enjoyed meeting Sierra King and look forward to participating in their care.  A copy of this report was sent to the requesting provider on this date.  Electronically Signed: Greggory Keen 05/16/2016, 9:50 AM   I spent a total of  40 Minutes   in face to face in clinical consultation, greater than 50% of which was counseling/coordinating care for this patient with uterine fibroids and abnormal bleeding.

## 2016-05-26 ENCOUNTER — Other Ambulatory Visit (HOSPITAL_COMMUNITY): Payer: Self-pay | Admitting: Interventional Radiology

## 2016-05-26 DIAGNOSIS — D250 Submucous leiomyoma of uterus: Secondary | ICD-10-CM

## 2016-06-06 ENCOUNTER — Ambulatory Visit
Admission: RE | Admit: 2016-06-06 | Discharge: 2016-06-06 | Disposition: A | Discharge status: Home / Self Care | Payer: BLUE CROSS/BLUE SHIELD | Source: Ambulatory Visit | Attending: Interventional Radiology | Admitting: Interventional Radiology

## 2016-06-06 DIAGNOSIS — D250 Submucous leiomyoma of uterus: Secondary | ICD-10-CM

## 2016-06-08 ENCOUNTER — Other Ambulatory Visit (HOSPITAL_COMMUNITY): Payer: Self-pay | Admitting: Interventional Radiology

## 2016-06-08 DIAGNOSIS — D250 Submucous leiomyoma of uterus: Secondary | ICD-10-CM

## 2016-06-27 ENCOUNTER — Other Ambulatory Visit (HOSPITAL_COMMUNITY): Payer: BLUE CROSS/BLUE SHIELD

## 2016-07-14 ENCOUNTER — Encounter: Payer: Self-pay | Admitting: Obstetrics and Gynecology

## 2016-08-04 ENCOUNTER — Other Ambulatory Visit: Payer: Self-pay | Admitting: Radiology

## 2016-08-07 ENCOUNTER — Ambulatory Visit (HOSPITAL_COMMUNITY)
Admission: RE | Admit: 2016-08-07 | Discharge: 2016-08-07 | Disposition: A | Discharge status: Home / Self Care | Payer: BLUE CROSS/BLUE SHIELD | Source: Ambulatory Visit | Attending: Interventional Radiology | Admitting: Interventional Radiology

## 2016-08-07 ENCOUNTER — Observation Stay (HOSPITAL_COMMUNITY)
Admission: RE | Admit: 2016-08-07 | Discharge: 2016-08-08 | Disposition: A | Discharge status: Home / Self Care | Payer: BLUE CROSS/BLUE SHIELD | Source: Ambulatory Visit | Attending: Interventional Radiology | Admitting: Interventional Radiology

## 2016-08-07 ENCOUNTER — Encounter (HOSPITAL_COMMUNITY): Payer: Self-pay

## 2016-08-07 DIAGNOSIS — I10 Essential (primary) hypertension: Secondary | ICD-10-CM | POA: Diagnosis not present

## 2016-08-07 DIAGNOSIS — D219 Benign neoplasm of connective and other soft tissue, unspecified: Secondary | ICD-10-CM | POA: Diagnosis present

## 2016-08-07 DIAGNOSIS — N920 Excessive and frequent menstruation with regular cycle: Secondary | ICD-10-CM | POA: Diagnosis not present

## 2016-08-07 DIAGNOSIS — D250 Submucous leiomyoma of uterus: Secondary | ICD-10-CM

## 2016-08-07 DIAGNOSIS — D259 Leiomyoma of uterus, unspecified: Principal | ICD-10-CM | POA: Insufficient documentation

## 2016-08-07 DIAGNOSIS — M1990 Unspecified osteoarthritis, unspecified site: Secondary | ICD-10-CM | POA: Insufficient documentation

## 2016-08-07 HISTORY — DX: Unspecified osteoarthritis, unspecified site: M19.90

## 2016-08-07 HISTORY — PX: UTERINE ARTERY EMBOLIZATION: SHX2629

## 2016-08-07 HISTORY — DX: Essential (primary) hypertension: I10

## 2016-08-07 HISTORY — PX: IR EMBO TUMOR ORGAN ISCHEMIA INFARCT INC GUIDE ROADMAPPING: IMG5449

## 2016-08-07 HISTORY — PX: IR US GUIDE VASC ACCESS RIGHT: IMG2390

## 2016-08-07 HISTORY — PX: IR ANGIOGRAM PELVIS SELECTIVE OR SUPRASELECTIVE: IMG661

## 2016-08-07 HISTORY — PX: IR US GUIDE VASC ACCESS LEFT: IMG2389

## 2016-08-07 HISTORY — PX: IR ANGIOGRAM SELECTIVE EACH ADDITIONAL VESSEL: IMG667

## 2016-08-07 LAB — COMPREHENSIVE METABOLIC PANEL
ALT: 13 U/L — ABNORMAL LOW (ref 14–54)
AST: 18 U/L (ref 15–41)
Albumin: 4.2 g/dL (ref 3.5–5.0)
Alkaline Phosphatase: 52 U/L (ref 38–126)
Anion gap: 10 (ref 5–15)
BILIRUBIN TOTAL: 0.5 mg/dL (ref 0.3–1.2)
BUN: 9 mg/dL (ref 6–20)
CO2: 23 mmol/L (ref 22–32)
Calcium: 9.4 mg/dL (ref 8.9–10.3)
Chloride: 107 mmol/L (ref 101–111)
Creatinine, Ser: 0.64 mg/dL (ref 0.44–1.00)
GFR calc Af Amer: >60 mL/min (ref >60)
Glucose, Bld: 99 mg/dL (ref 65–99)
POTASSIUM: 3.7 mmol/L (ref 3.5–5.1)
Sodium: 140 mmol/L (ref 135–145)
TOTAL PROTEIN: 8 g/dL (ref 6.5–8.1)

## 2016-08-07 LAB — CBC WITH DIFFERENTIAL/PLATELET
BASOS ABS: 0 10*3/uL (ref 0.0–0.1)
Basophils Relative: 0 %
Eosinophils Absolute: 0.3 10*3/uL (ref 0.0–0.7)
Eosinophils Relative: 4 %
HEMATOCRIT: 28.4 % — AB (ref 36.0–46.0)
Hemoglobin: 8.9 g/dL — ABNORMAL LOW (ref 12.0–15.0)
Lymphocytes Relative: 45 %
Lymphs Abs: 3.2 10*3/uL (ref 0.7–4.0)
MCH: 24.8 pg — AB (ref 26.0–34.0)
MCHC: 31.3 g/dL (ref 30.0–36.0)
MCV: 79.1 fL (ref 78.0–100.0)
Monocytes Absolute: 0.3 10*3/uL (ref 0.1–1.0)
Monocytes Relative: 4 %
NEUTROS PCT: 47 %
Neutro Abs: 3.3 10*3/uL (ref 1.7–7.7)
Platelets: 357 10*3/uL (ref 150–400)
RBC: 3.59 MIL/uL — ABNORMAL LOW (ref 3.87–5.11)
RDW: 17.6 % — AB (ref 11.5–15.5)
WBC: 7.1 10*3/uL (ref 4.0–10.5)

## 2016-08-07 LAB — PROTIME-INR
INR: 1.01
Prothrombin Time: 13.3 seconds (ref 11.4–15.2)

## 2016-08-07 LAB — HCG, SERUM, QUALITATIVE: PREG SERUM: NEGATIVE

## 2016-08-07 MED ORDER — PROMETHAZINE HCL 25 MG PO TABS
25.0000 mg | ORAL_TABLET | Freq: Three times a day (TID) | ORAL | Status: DC | PRN
  Filled 2016-08-07: qty 1

## 2016-08-07 MED ORDER — IRBESARTAN 300 MG PO TABS
300.0000 mg | ORAL_TABLET | Freq: Every day | ORAL | Status: DC
  Administered 2016-08-08: 300 mg via ORAL
  Filled 2016-08-07: qty 1

## 2016-08-07 MED ORDER — NITROGLYCERIN 1 MG/10 ML FOR IR/CATH LAB
INTRA_ARTERIAL | Status: AC | PRN
  Administered 2016-08-07: 300 ug via INTRA_ARTERIAL

## 2016-08-07 MED ORDER — AZILSARTAN-CHLORTHALIDONE 40-12.5 MG PO TABS
1.0000 | ORAL_TABLET | Freq: Every day | ORAL | Status: DC
Start: 2016-08-07 — End: 2016-08-07

## 2016-08-07 MED ORDER — DOCUSATE SODIUM 100 MG PO CAPS
100.0000 mg | ORAL_CAPSULE | Freq: Two times a day (BID) | ORAL | Status: DC
  Administered 2016-08-07 – 2016-08-08 (×2): 100 mg via ORAL
  Filled 2016-08-07 (×2): qty 1

## 2016-08-07 MED ORDER — SODIUM CHLORIDE 0.9 % IV SOLN
250.0000 mL | INTRAVENOUS | Status: DC | PRN
  Administered 2016-08-07: 1000 mL via INTRAVENOUS

## 2016-08-07 MED ORDER — HYDROCHLOROTHIAZIDE 12.5 MG PO CAPS
12.5000 mg | ORAL_CAPSULE | Freq: Every day | ORAL | Status: DC
  Administered 2016-08-07 – 2016-08-08 (×2): 12.5 mg via ORAL
  Filled 2016-08-07 (×2): qty 1

## 2016-08-07 MED ORDER — CEFAZOLIN SODIUM-DEXTROSE 2-4 GM/100ML-% IV SOLN
2.0000 g | INTRAVENOUS | Status: DC
  Administered 2016-08-07: 2 g via INTRAVENOUS

## 2016-08-07 MED ORDER — PROMETHAZINE HCL 25 MG RE SUPP: 25.0000 mg | Freq: Three times a day (TID) | RECTAL | Status: DC | PRN

## 2016-08-07 MED ORDER — NALOXONE HCL 0.4 MG/ML IJ SOLN: 0.4000 mg | INTRAMUSCULAR | Status: DC | PRN

## 2016-08-07 MED ORDER — ONDANSETRON HCL 4 MG/2ML IJ SOLN: 4.0000 mg | Freq: Four times a day (QID) | INTRAMUSCULAR | Status: DC | PRN

## 2016-08-07 MED ORDER — DIPHENHYDRAMINE HCL 12.5 MG/5ML PO ELIX
12.5000 mg | ORAL_SOLUTION | Freq: Four times a day (QID) | ORAL | Status: DC | PRN
  Filled 2016-08-07: qty 5

## 2016-08-07 MED ORDER — SODIUM CHLORIDE 0.9 % IV SOLN
INTRAVENOUS | Status: DC
  Administered 2016-08-07: 12:00:00 via INTRAVENOUS

## 2016-08-07 MED ORDER — MIDAZOLAM HCL 2 MG/2ML IJ SOLN
INTRAMUSCULAR | Status: AC | PRN
  Administered 2016-08-07 (×2): 1 mg via INTRAVENOUS

## 2016-08-07 MED ORDER — KETOROLAC TROMETHAMINE 30 MG/ML IJ SOLN
30.0000 mg | Freq: Four times a day (QID) | INTRAMUSCULAR | Status: DC
  Administered 2016-08-07 – 2016-08-08 (×3): 30 mg via INTRAVENOUS
  Filled 2016-08-07 (×4): qty 1

## 2016-08-07 MED ORDER — SODIUM CHLORIDE 0.9% FLUSH: 3.0000 mL | INTRAVENOUS | Status: DC | PRN

## 2016-08-07 MED ORDER — LIDOCAINE HCL 1 % IJ SOLN
INTRAMUSCULAR | Status: AC
  Filled 2016-08-07: qty 20

## 2016-08-07 MED ORDER — IOPAMIDOL (ISOVUE-300) INJECTION 61%
INTRAVENOUS | Status: AC
  Administered 2016-08-07: 70 mL via INTRA_ARTERIAL
  Filled 2016-08-07: qty 200

## 2016-08-07 MED ORDER — CEFAZOLIN SODIUM-DEXTROSE 2-4 GM/100ML-% IV SOLN
INTRAVENOUS | Status: AC
  Filled 2016-08-07: qty 100

## 2016-08-07 MED ORDER — SODIUM CHLORIDE 0.9 % IV SOLN: 250.0000 mL | INTRAVENOUS | Status: DC | PRN

## 2016-08-07 MED ORDER — MIDAZOLAM HCL 2 MG/2ML IJ SOLN
INTRAMUSCULAR | Status: AC
  Filled 2016-08-07: qty 4

## 2016-08-07 MED ORDER — FENTANYL CITRATE (PF) 100 MCG/2ML IJ SOLN
INTRAMUSCULAR | Status: AC
  Filled 2016-08-07: qty 4

## 2016-08-07 MED ORDER — KETOROLAC TROMETHAMINE 30 MG/ML IJ SOLN: 30.0000 mg | INTRAMUSCULAR | Status: DC

## 2016-08-07 MED ORDER — FENTANYL CITRATE (PF) 100 MCG/2ML IJ SOLN
INTRAMUSCULAR | Status: AC | PRN
  Administered 2016-08-07 (×2): 50 ug via INTRAVENOUS

## 2016-08-07 MED ORDER — AZILSARTAN-CHLORTHALIDONE 40-12.5 MG PO TABS: ORAL_TABLET | Freq: Every day | ORAL | Status: DC

## 2016-08-07 MED ORDER — IOPAMIDOL (ISOVUE-300) INJECTION 61%
50.0000 mL | Freq: Once | INTRAVENOUS | Status: AC | PRN
Start: 2016-08-07 — End: 2016-08-07
  Administered 2016-08-07: 50 mL via INTRA_ARTERIAL

## 2016-08-07 MED ORDER — KETOROLAC TROMETHAMINE 30 MG/ML IJ SOLN
INTRAMUSCULAR | Status: AC
  Administered 2016-08-07: 30 mg
  Filled 2016-08-07: qty 1

## 2016-08-07 MED ORDER — HYDROCODONE-ACETAMINOPHEN 5-325 MG PO TABS: 1.0000 | ORAL_TABLET | ORAL | Status: DC | PRN

## 2016-08-07 MED ORDER — SODIUM CHLORIDE 0.9% FLUSH
3.0000 mL | Freq: Two times a day (BID) | INTRAVENOUS | Status: DC
  Administered 2016-08-08: 3 mL via INTRAVENOUS

## 2016-08-07 MED ORDER — LIDOCAINE HCL 1 % IJ SOLN
INTRAMUSCULAR | Status: AC | PRN
  Administered 2016-08-07 (×2): 10 mL

## 2016-08-07 MED ORDER — DIPHENHYDRAMINE HCL 50 MG/ML IJ SOLN: 12.5000 mg | Freq: Four times a day (QID) | INTRAMUSCULAR | Status: DC | PRN

## 2016-08-07 MED ORDER — HYDROMORPHONE 1 MG/ML IV SOLN
INTRAVENOUS | Status: DC
  Administered 2016-08-07: 0.6 mg via INTRAVENOUS
  Administered 2016-08-07: 16:00:00 via INTRAVENOUS
  Administered 2016-08-08: 0.3 mg via INTRAVENOUS
  Filled 2016-08-07: qty 25

## 2016-08-07 MED ORDER — SODIUM CHLORIDE 0.9% FLUSH: 9.0000 mL | INTRAVENOUS | Status: DC | PRN

## 2016-08-07 MED ORDER — IOPAMIDOL (ISOVUE-300) INJECTION 61%
80.0000 mL | Freq: Once | INTRAVENOUS | Status: AC | PRN
  Administered 2016-08-07: 70 mL via INTRA_ARTERIAL

## 2016-08-07 MED ORDER — DEXAMETHASONE 4 MG PO TABS
8.0000 mg | ORAL_TABLET | Freq: Once | ORAL | Status: AC
  Administered 2016-08-07: 8 mg via ORAL
  Filled 2016-08-07: qty 2

## 2016-08-07 NOTE — Sedation Documentation (Signed)
Patient is resting comfortably. 

## 2016-08-07 NOTE — Discharge Instructions (Signed)
Artificial Tears eye solution What is this medicine? ARTIFICIAL TEARS (ahr tuh FISH uhl teerz) eye solution soothes irritation and discomfort caused by dry eyes. This medicine may be used for other purposes; ask your health care provider or pharmacist if you have questions. COMMON BRAND NAME(S): Akwa Tears, Akwa Tears Renewed, Artificial Tears, Bion Tears, Blink Tears, Clear eyes, Clear eyes Outdoor Dry Eye Protection, FreshKote, GenTeal Mild, GenTeal Moderate, GenTeal PF, Gonak, Goniosoft, Hypo Tears, Isopto Tears, LiquiTears, Lubricating Plus, Moisture Eyes, Moisture Eyes Preservative Free, Natural Balance Tears, Nature's Tears, Opti-Free, Puralube Tears, Refresh, Refresh Celluvisc, Refresh Contacts Comfort, Refresh Endura, Refresh Optive, Refresh Optive Sensitive, Refresh Plus, Refresh Tears, Retaine CMC, Systane Balance, Teargen, Tears Naturale Forte, Tears Naturale II, Tears Renewed, TheraTears, Visine Advanced, Visine Dry Eye Relief, Visine Pure Tears, Visine Tears, Visine Tired Eye Relief, Viva What should I tell my health care provider before I take this medicine? -change in vision -eye infection or trauma -wear contact lenses -an unusual or allergic reaction to artificial tears, other medicines, foods, dyes, or preservatives -pregnant or trying to get pregnant -breast-feeding How should I use this medicine? This medicine is only for use in the eye. Do not take by mouth. Follow the directions on the label. Wash hands before and after use. Tilt the head back slightly and pull down the lower eyelid with your index finger to form a pouch. Try not to touch the tip of the dropper to your eye, fingertips, or any other surface. Squeeze the prescribed number of drops (usually one or two drops) into the pouch. Close the eye gently for a few moments to allow the drops to be in contact with the eye. Use your medicine at regular intervals. Do not use your medicine more often than directed. Talk to your  pediatrician regarding the use of this medicine in children. While this medicine may be used in children as young as 6 years for selected conditions, precautions do apply. Overdosage: If you think you have taken too much of this medicine contact a poison control center or emergency room at once. NOTE: This medicine is only for you. Do not share this medicine with others. What if I miss a dose? If you miss a dose, use it as soon as you can. If it is almost time for your next dose, use only that dose. Do not use double or extra doses. What may interact with this medicine? Interactions are not expected. If you are using other eye drops with this medicine, separate the application of the different eye drops by roughly 5 minutes. This ensures that the eye drops do not interfere with each other. If you are using both eye drops and an eye ointment, use the eye drops 10 minutes before the eye ointment so that the eye ointment does not interfere with the action of the drops. This list may not describe all possible interactions. Give your health care provider a list of all the medicines, herbs, non-prescription drugs, or dietary supplements you use. Also tell them if you smoke, drink alcohol, or use illegal drugs. Some items may interact with your medicine. What should I watch for while using this medicine? If you experience eye pain, changes in vision, continued redness or irritation of the eye, or if your eye condition gets worse or lasts longer than 72 hours, discontinue use and consult your health care professional. To avoid contamination of this product, do not touch the tip of the container to any surface. Do not share this  medicine with others. If the product changes color or becomes cloudy, do not use. If you wear contact lenses, you should remove them before putting the drops in your eyes. Wait at least 15 minutes after putting the drops in your eyes before putting your contact lenses back in. What side  effects may I notice from receiving this medicine? Side effects that you should report to your doctor or health care professional as soon as possible: -allergic reactions like skin rash, itching or hives, swelling of the face, lips, or tongue -change in vision -eye irritation or redness that gets worse or lasts more than 72 hours -eye pain Side effects that usually do not require medical attention (report to your doctor or health care professional if they continue or are bothersome): -temporary stinging or blurred vision when applying the eye drops This list may not describe all possible side effects. Call your doctor for medical advice about side effects. You may report side effects to FDA at 1-800-FDA-1088. Where should I keep my medicine? Keep out of the reach of children. Store at room temperature between 15 and 30 degrees C (59 and 86 degrees F). Do not freeze. Throw away any unused medicine after the expiration date. Once the product is opened, most experts recommend discarding the product after 30 days. NOTE: This sheet is a summary. It may not cover all possible information. If you have questions about this medicine, talk to your doctor, pharmacist, or health care provider.  2018 Elsevier/Gold Standard (2007-07-19 14:24:03) Uterine Artery Embolization for Fibroids, Care After Refer to this sheet in the next few weeks. These instructions provide you with information on caring for yourself after your procedure. Your health care provider may also give you more specific instructions. Your treatment has been planned according to current medical practices, but problems sometimes occur. Call your health care provider if you have any problems or questions after your procedure. What can I expect after the procedure? After your procedure, it is typical to have cramping in the pelvis. You will be given pain medicine to control it. Follow these instructions at home:  Only take over-the-counter or  prescription medicines for pain, discomfort, or fever as directed by your health care provider.  Do not take aspirin. It can cause bleeding.  Follow your health care providers advice regarding medicines given to you, diet, activity, and when to begin sexual activity.  See your health care provider for follow-up care as directed. Contact a health care provider if:  You have a fever.  You have redness, swelling, and pain around your incision site.  You have pus draining from your incision.  You have a rash. Get help right away if:  You have bleeding from your incision site.  You have difficulty breathing.  You have chest pain.  You have severe abdominal pain.  You have leg pain.  You become dizzy and faint. This information is not intended to replace advice given to you by your health care provider. Make sure you discuss any questions you have with your health care provider. Document Released: 11/06/2012 Document Revised: 06/24/2015 Document Reviewed: 08/01/2012 Elsevier Interactive Patient Education  [employer].

## 2016-08-07 NOTE — Procedures (Signed)
S/p UFE  No comp Stable  EBL 5cc Full report in PACS

## 2016-08-07 NOTE — Progress Notes (Signed)
Patient ID: Sierra King, female   DOB: 11/11/1975, 41 y.o.   MRN: [id number] Patient slightly drowsy, with only minimal pelvic cramping; denies chest pain, dyspnea, cough, lightheadedness or dizziness; has used PCA Dilaudid few times. Afebrile, BP 185/95, heart rate 43 Abdomen soft, positive bowel sounds, nontender. Heart with bradycardic but regular rhythm. Puncture sites right/left common femoral arteries clean, dry, nontender, no hematoma, intact distal pulses.  Assessment/plan: Symptomatic uterine fibroids, status post bilateral uterine artery embolization earlier today. For overnight obs; minimize dilaudid unless pain score moderate to severe; continue IV fluids; D/C Foley catheter later this evening; follow-up with Dr. Annamaria Boots in the IR clinic in 3-4 weeks.  Rowe Robert, [facility]

## 2016-08-07 NOTE — H&P (Signed)
    Referring Physician(s): Cousins,S  Supervising Physician: Daryll Brod  Patient Status:  [facility]  Chief Complaint: Symptomatic uterine fibroids/heavy menstrual bleeding   Subjective: Patient familiar to IR service from recent consultation with Dr. Annamaria Boots on 05/16/16 to discuss treatment options for symptomatic uterine fibroids. She was deemed an appropriate candidate for bilateral uterine artery embolization and presents today for the procedure. She is currently on her cycle. She denies fever, headache, chest pain, dyspnea, cough, worsening abdominal or back pain, nausea, vomiting, hematuria, blood in stool. Additional history as below. Past Medical History:  Diagnosis Date  . Allergy   . Anemia   . Arthritis   . Hypertension    Past Surgical History:  Procedure Laterality Date  . BREAST LUMPECTOMY Right   . CESAREAN SECTION    . IR RADIOLOGIST EVAL & MGMT  05/16/2016  . WISDOM TOOTH EXTRACTION        Allergies: Latex  Medications: Prior to Admission medications   Medication Sig Start Date End Date Taking? Authorizing Provider  Azilsartan-Chlorthalidone 40-12.5 MG TABS Take by mouth daily.   Yes [provider]  Amlodipine Besylate-Valsartan (EXFORGE PO) Take by mouth.    [provider]  METFORMIN HCL PO Take by mouth.    [provider]     Vital Signs: BP (!) 144/86 (BP Location: Right Arm)   Pulse 80   Temp 98.8 F (37.1 C) (Oral)   Resp 16   Ht 4' 10.75" (1.492 m)   Wt 210 lb (95.3 kg)   LMP 08/01/2016 (Exact Date)   SpO2 100%   BMI 42.78 kg/m   Physical Exam awake, alert. Chest clear to auscultation bilaterally. Heart with regular rate and rhythm. Abdomen obese, soft, positive bowel sounds, nontender. No lower extremity edema, intact distal pulses.  Imaging: No results found.  Labs:  CBC: No results for input(s): WBC, HGB, HCT, PLT in the last 8760 hours.  COAGS: No results for input(s): INR, APTT in the last 8760  hours.  BMP: No results for input(s): NA, K, CL, CO2, GLUCOSE, BUN, CALCIUM, CREATININE, GFRNONAA, GFRAA in the last 8760 hours.  Invalid input(s): CMP  LIVER FUNCTION TESTS: No results for input(s): BILITOT, AST, ALT, ALKPHOS, PROT, ALBUMIN in the last 8760 hours.  Assessment and Plan: Patient with history of symptomatic uterine fibroids. Seen in consultation by Dr. Annamaria Boots on 05/16/16 and deemed an appropriate candidate for bilateral uterine artery embolization. She presents today for the procedure. Details/risks of procedure, including but not limited to, internal bleeding, infection, contrast nephropathy, nontarget embolization, discussed with patient and family with their understanding and consent. Post procedure she will be admitted to the hospital for overnight observation for pain control.   Electronically Signed: D. Rowe Robert, PA-C 08/07/2016, 12:22 PM   I spent a total of 30 minutes at the the patient's bedside AND on the patient's hospital floor or unit, greater than 50% of which was counseling/coordinating care for bilateral uterine artery embolization

## 2016-08-08 ENCOUNTER — Other Ambulatory Visit: Payer: Self-pay | Admitting: Physician Assistant

## 2016-08-08 DIAGNOSIS — D250 Submucous leiomyoma of uterus: Secondary | ICD-10-CM

## 2016-08-08 DIAGNOSIS — D259 Leiomyoma of uterus, unspecified: Secondary | ICD-10-CM | POA: Diagnosis not present

## 2016-08-08 NOTE — Progress Notes (Signed)
Pt was discharged home today. Instructions were reviewed with patient, and questions were answered. Pt was taken to main entrance via wheelchair by NT.  

## 2016-08-08 NOTE — Discharge Summary (Signed)
Patient ID: Sierra King MRN: [id number] DOB/AGE: [birth year] 40 y.o.  Admit date: 08/07/2016 Discharge date: 08/08/2016  Supervising Physician: Arne Cleveland  Patient Status: [facility] - In-pt  Admission Diagnoses: Symptomatic Uterine Fibroids  Discharge Diagnoses:  Active Problems:   Fibroids   Discharged Condition: good  Hospital Course:  Patient with history of symptomatic uterine fibroids. Seen was seen in consultation by Dr. Annamaria Boots on 05/16/16.  She was deemed an appropriate candidate for bilateral uterine artery embolization.  She underwent Uterine Fibroid Embolization yesterday with bilateral CFA punctures for access.  She has done very well.   She has voided. She is tolerating PO. She is ambulating well.  Consults: None  Treatments: INDICATION: SYMPTOMATIC UTERINE FIBROIDS, ABNORMAL UTERINE BLEEDING  EXAM: UTERINE FIBROID EMBOLIZATION  Date:  7/9/20187/09/2016 4:11 pm  Radiologist:  M. Daryll Brod, MD  Guidance:  Ultrasound fluoroscopic  MEDICATIONS: Ancef 2 g. The antibiotic was administered within 1 hour of the procedure  ANESTHESIA/SEDATION: Fentanyl 100 mcg IV; Versed 2.0 mg IV, 30 mg Toradol  Moderate Sedation Time:  60 minutes  The patient was continuously monitored during the procedure by the interventional radiology nurse under my direct supervision.  CONTRAST:  120 cc Isovue-300  FLUOROSCOPY TIME:  Fluoroscopy Time: 8 minutes 18 seconds (1,507 mGy).  COMPLICATIONS: None immediate.  PROCEDURE: Informed consent was obtained from the patient following explanation of the procedure, risks, benefits and alternatives. The patient understands, agrees and consents for the procedure. All questions were addressed. A time out was performed.  Maximal barrier sterile technique utilized including caps, mask, sterile gowns, sterile gloves, large sterile drape, hand hygiene, and betadine prep.  Under sterile conditions and local  anesthesia, bilateralcommon femoral artery access was performed with a micropuncture needle. Ultrasound was utilized for access. Images were obtained for documentation. Guidewires were advanced followed by bilateral 5-French sheaths. A 5-French C2 catheter was utilized to select the contralateral left internal iliac artery. Selective left internal iliac angiogram was performed. The tortuous left uterine artery was identified. Selective catheterization was performed of the left uterine artery with a microcatheter and micro guide wire. A selective left uterine angiogram was performed. This demonstrated patency of the left uterine artery. Left uterine artery is the non dominant supply. Access was adequate for embolization. For embolization, 1 vial of 500 - 741micron Embospheres and 1 vialof 700-900 micron Embospheres were injected into the left uterine artery. Post embolization angiogram confirms complete stasis of the left uterine vascular territory. Microcatheter was removed.  A second C2 catheter was utilized to select the right internal iliac artery. Selective right internal iliac angiogram was performed. The patent right uterine artery was identified. For selective catheterization, the micro catheter and guidewire were utilized to select the right uterine artery. Selective right uterine angiogram was performed. This demonstrated patency of the right uterine artery. Right ureter artery is enlarged in the dominant supply to the large fibroids. Catheter position was safe for embolization. Embolization was performed to complete stasis with injection of 2 vials of 500-700 micron Embospheres and 2 vialsof 700-900 micron Embospheres. Post embolization angiogram confirms complete stasis of the right uterine vascular territory.  Bilateral C2 catheters and micro catheters were removed.  Hemostasis obtained with manual compression bilaterally.  The patient tolerated the procedure well.  No immediate complication.  IMPRESSION: Successful bilateral uterine artery embolization (U F E)   Electronically Signed   By: Jerilynn Mages.  Shick M.D.   On: 08/07/2016 16:21   Discharge Exam: Blood pressure (!) 173/96, pulse 92,  temperature 98.8 F (37.1 C), temperature source Oral, resp. rate 18, height 4' 10.75" (1.492 m), weight 210 lb (95.3 kg), last menstrual period 08/01/2016, SpO2 100 %. Awake and alert NAD Heart RRR Lungs Clear Abdomen soft, NTND CFA stick sites without hematoma or pseudoaneurysm  Disposition: 01-Home or Self Care   Allergies as of 08/08/2016      Reactions   Latex Itching      Medication List    TAKE these medications   EDARBYCLOR 40-12.5 MG Tabs Generic drug:  Azilsartan-Chlorthalidone Take 1 tablet by mouth daily.         Electronically Signed: Murrell Redden, PA-C 08/08/2016, 11:28 AM   I have spent Less Than 30 Minutes discharging Ena Hersman.

## 2016-09-12 ENCOUNTER — Ambulatory Visit
Admission: RE | Admit: 2016-09-12 | Discharge: 2016-09-12 | Disposition: A | Discharge status: Home / Self Care | Payer: BLUE CROSS/BLUE SHIELD | Source: Ambulatory Visit | Attending: Physician Assistant | Admitting: Physician Assistant

## 2016-09-12 DIAGNOSIS — D250 Submucous leiomyoma of uterus: Secondary | ICD-10-CM

## 2016-09-12 HISTORY — DX: Benign neoplasm of connective and other soft tissue, unspecified: D21.9

## 2016-09-12 HISTORY — PX: IR RADIOLOGIST EVAL & MGMT: IMG5224

## 2016-09-12 NOTE — Progress Notes (Signed)
Patient ID: Sierra King, female   DOB: 1975/09/02, 41 y.o.   MRN: [id number]       Chief Complaint:  Uterine fibroids, 1 month status post uterine fibroid embolization   Referring Physician(s): Ardis Rowan  History of Present Illness: Sierra King is a 41 y.o. female with systematic uterine fibroids and abnormal menstrual bleeding and pelvic cramping. She underwent successful uterine fibroid embolization approximately one month ago. Over the last month she has recovered very well. 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 No current physical limitations or complaints. She reports minor spotting with her most recent cycle and some minor pelvic cramping. She is not requiring any pain medications. No interval fevers or abnormal discharge.  Past Medical History:  Diagnosis Date  . Allergy   . Anemia   . Arthritis   . Fibroids    [place] 08/07/2016  . Hypertension     Past Surgical History:  Procedure Laterality Date  . BREAST LUMPECTOMY Right   . CESAREAN SECTION    . IR ANGIOGRAM PELVIS SELECTIVE OR SUPRASELECTIVE  08/07/2016  . IR ANGIOGRAM PELVIS SELECTIVE OR SUPRASELECTIVE  08/07/2016  . IR ANGIOGRAM SELECTIVE EACH ADDITIONAL VESSEL  08/07/2016  . IR ANGIOGRAM SELECTIVE EACH ADDITIONAL VESSEL  08/07/2016  . IR EMBO TUMOR ORGAN ISCHEMIA INFARCT INC GUIDE ROADMAPPING  08/07/2016  . IR RADIOLOGIST EVAL & MGMT  05/16/2016  . IR US GUIDE VASC ACCESS LEFT  08/07/2016  . IR US GUIDE VASC ACCESS RIGHT  08/07/2016  . UTERINE ARTERY EMBOLIZATION  08/07/2016  . WISDOM TOOTH EXTRACTION      Allergies: Latex  Medications: Prior to Admission medications   Medication Sig Start Date End Date Taking? Authorizing Provider  Azilsartan-Chlorthalidone (EDARBYCLOR) 40-12.5 MG TABS Take 1 tablet by mouth daily.    [provider]     Family History  Problem Relation Age of Onset  . Diabetes Mother   . Hypertension Mother   . Diabetes Father   . Hypertension Father     Social History     Social History  . Marital status: Married    Spouse name: N/A  . Number of children: N/A  . Years of education: N/A   Social History Main Topics  . Smoking status: Never Smoker  . Smokeless tobacco: Never Used  . Alcohol use Yes     Comment: occasional glass of wine or beer  . Drug use: Yes    Types: Marijuana     Comment: OCASSIONAL  . Sexual activity: Yes    Partners: Male    Birth control/ protection: None   Other Topics Concern  . Not on file   Social History Narrative  . No narrative on file      Review of Systems: A 12 point ROS discussed and pertinent positives are indicated in the HPI above.  All other systems are negative.  Review of Systems  Vital Signs: BP (!) 168/95   Pulse 81   Temp 98.1 F (36.7 C) (Oral)   Resp 15   Ht 5\' 2"  (1.575 m)   Wt 208 lb (94.3 kg)   LMP 09/01/2016   SpO2 100%   BMI 38.04 kg/m   Physical Exam  Constitutional: She appears well-developed and well-nourished. No distress.  Eyes: Conjunctivae are normal. No scleral icterus.  Cardiovascular: Intact distal pulses.   Abdominal: Soft. Bowel sounds are normal. She exhibits no distension and no mass.  Musculoskeletal: She exhibits no edema or deformity.  Bilateral femoral puncture sites  are well-healed. Normal femoral and pedal pulses.  Skin: She is not diaphoretic.      Imaging: No results found.  Labs:  CBC:  Recent Labs  08/07/16 1238  WBC 7.1  HGB 8.9*  HCT 28.4*  PLT 357    COAGS:  Recent Labs  08/07/16 1238  INR 1.01    BMP:  Recent Labs  08/07/16 1238  NA 140  K 3.7  CL 107  CO2 23  GLUCOSE 99  BUN 9  CALCIUM 9.4  CREATININE 0.64  GFRNONAA >60  GFRAA >60    LIVER FUNCTION TESTS:  Recent Labs  08/07/16 1238  BILITOT 0.5  AST 18  ALT 13*  ALKPHOS 52  PROT 8.0  ALBUMIN 4.2     Assessment and Plan:  1 month status post uterine fibroid embolization for systematic uterine fibroids, pelvic pain and cramping, and abnormal  menstrual bleeding. Over the last month she has recovered very well. No current physical limitations. She is back to work. No fevers or abnormal discharge.  Plan: Telephone follow-up in 3 months. Repeat MRI as an outpatient at 6 months.  Electronically Signed: Greggory Keen 09/12/2016, 9:26 AM   I spent a total of    15 Minutes in face to face in clinical consultation, greater than 50% of which was counseling/coordinating care for this patient with uterine fibroids

## 2016-11-13 ENCOUNTER — Encounter: Payer: Self-pay | Admitting: Interventional Radiology

## 2017-01-31 ENCOUNTER — Other Ambulatory Visit (HOSPITAL_COMMUNITY): Payer: Self-pay | Admitting: Interventional Radiology

## 2017-01-31 ENCOUNTER — Other Ambulatory Visit: Payer: Self-pay | Admitting: Obstetrics and Gynecology

## 2017-01-31 DIAGNOSIS — D219 Benign neoplasm of connective and other soft tissue, unspecified: Secondary | ICD-10-CM

## 2017-02-27 ENCOUNTER — Ambulatory Visit
Admission: RE | Admit: 2017-02-27 | Discharge: 2017-02-27 | Disposition: A | Discharge status: Home / Self Care | Payer: BLUE CROSS/BLUE SHIELD | Source: Ambulatory Visit | Attending: Obstetrics and Gynecology | Admitting: Obstetrics and Gynecology

## 2017-02-27 ENCOUNTER — Encounter: Payer: Self-pay | Admitting: Radiology

## 2017-02-27 ENCOUNTER — Ambulatory Visit
Admission: RE | Admit: 2017-02-27 | Discharge: 2017-02-27 | Disposition: A | Discharge status: Home / Self Care | Payer: BLUE CROSS/BLUE SHIELD | Source: Ambulatory Visit | Attending: Interventional Radiology | Admitting: Interventional Radiology

## 2017-02-27 DIAGNOSIS — D219 Benign neoplasm of connective and other soft tissue, unspecified: Secondary | ICD-10-CM

## 2017-02-27 HISTORY — PX: IR RADIOLOGIST EVAL & MGMT: IMG5224

## 2017-02-27 MED ORDER — GADOBENATE DIMEGLUMINE 529 MG/ML IV SOLN: 20.0000 mL | Freq: Once | INTRAVENOUS | Status: DC | PRN

## 2017-02-27 NOTE — Progress Notes (Signed)
Patient ID: Sierra King, female   DOB: 07-01-1975, 42 y.o.   MRN: [id number]       Chief Complaint:  uterine fibroids, status post [place] 6 months ago Referring Physician(s): cousins  History of Present Illness: Sierra King is a 42 y.o. female with systematic uterine fibroids. She is now 6 months status post uterine artery embolization. She has recovered very well. She is back to work. No physical limitations. Her symptoms have improved. Menstrual cycle is now 5 days with minor light spotting. No interperiod bleeding.  No significant associated pelvic pain, urinary frequency or urgency. Dyspareunia has resolved. No significant or abnormal drainage. No recent GYN infections. Post treatment MRI was performed earlier today.  Past Medical History:  Diagnosis Date  . Allergy   . Anemia   . Arthritis   . Fibroids    [place] 08/07/2016  . Hypertension     Past Surgical History:  Procedure Laterality Date  . BREAST LUMPECTOMY Right   . CESAREAN SECTION    . IR ANGIOGRAM PELVIS SELECTIVE OR SUPRASELECTIVE  08/07/2016  . IR ANGIOGRAM PELVIS SELECTIVE OR SUPRASELECTIVE  08/07/2016  . IR ANGIOGRAM SELECTIVE EACH ADDITIONAL VESSEL  08/07/2016  . IR ANGIOGRAM SELECTIVE EACH ADDITIONAL VESSEL  08/07/2016  . IR EMBO TUMOR ORGAN ISCHEMIA INFARCT INC GUIDE ROADMAPPING  08/07/2016  . IR RADIOLOGIST EVAL & MGMT  05/16/2016  . IR RADIOLOGIST EVAL & MGMT  09/12/2016  . IR US GUIDE VASC ACCESS LEFT  08/07/2016  . IR US GUIDE VASC ACCESS RIGHT  08/07/2016  . UTERINE ARTERY EMBOLIZATION  08/07/2016  . WISDOM TOOTH EXTRACTION      Allergies: Latex  Medications: Prior to Admission medications   Medication Sig Start Date End Date Taking? Authorizing Provider  amLODipine (NORVASC) 10 MG tablet Take 10 mg by mouth daily.   Yes [provider]  telmisartan-hydrochlorothiazide (MICARDIS HCT) 40-12.5 MG tablet Take 1 tablet by mouth daily.   Yes [provider]  Azilsartan-Chlorthalidone (EDARBYCLOR)  40-12.5 MG TABS Take 1 tablet by mouth daily.    [provider]     Family History  Problem Relation Age of Onset  . Diabetes Mother   . Hypertension Mother   . Diabetes Father   . Hypertension Father     Social History   Socioeconomic History  . Marital status: Married    Spouse name: Not on file  . Number of children: Not on file  . Years of education: Not on file  . Highest education level: Not on file  Social Needs  . Financial resource strain: Not on file  . Food insecurity - worry: Not on file  . Food insecurity - inability: Not on file  . Transportation needs - medical: Not on file  . Transportation needs - non-medical: Not on file  Occupational History  . Not on file  Tobacco Use  . Smoking status: Never Smoker  . Smokeless tobacco: Never Used  Substance and Sexual Activity  . Alcohol use: Yes    Comment: occasional glass of wine or beer  . Drug use: Yes    Types: Marijuana    Comment: OCASSIONAL  . Sexual activity: Yes    Partners: Male    Birth control/protection: None  Other Topics Concern  . Not on file  Social History Narrative  . Not on file      Review of Systems: A 12 point ROS discussed and pertinent positives are indicated in the HPI above.  All other systems are  negative.  Review of Systems  Vital Signs: BP 138/78   Pulse 93   Temp 99.2 F (37.3 C) (Oral)   Resp 14   Ht 5\' 2"  (1.575 m)   Wt 208 lb (94.3 kg)   LMP 02/25/2017   SpO2 100%   BMI 38.04 kg/m   Physical Exam  Constitutional: She appears well-developed and well-nourished. No distress.  Eyes: Conjunctivae are normal. No scleral icterus.  Cardiovascular: Intact distal pulses.  Abdominal: Soft. Bowel sounds are normal. She exhibits no distension.  Skin: She is not diaphoretic.      Imaging: Mr Pelvis W Wo Contrast  Result Date: 02/27/2017 CLINICAL DATA:  Uterine fibroids. Followup 6 months status post uterine artery embolization. EXAM: MRI PELVIS WITHOUT AND  WITH CONTRAST TECHNIQUE: Multiplanar multisequence MR imaging of the pelvis was performed both before and after administration of intravenous contrast. CONTRAST:  20 mL MultiHance COMPARISON:  06/06/2016 FINDINGS: Urinary Tract: No bladder abnormality identified. Small horseshoe shaped urethral diverticulum again noted. Bowel: Unremarkable pelvic bowel loops. Vascular/Lymphatic: Unremarkable. No pathologically enlarged pelvic lymph nodes identified. Reproductive: -- Uterus: Measures 13.1 x 7.0 x 9.6 cm (volume = 460 cm^3). Multiple uterine fibroids are again seen but have decreased in size since previous study. The largest fibroid in the right lateral corpus currently measures 7.4 x 5.4 x 5.0 cm compared with 7.8 x 5.9 x 5.7 cm previously. These fibroids no longer show internal contrast enhancement. Thin endometrium noted. Normal appearance of cervix. -- Right ovary: Not well visualized, however no adnexal mass identified. -- Left ovary: Appears normal. No mass or inflammatory process identified. Other: No peritoneal masses or abnormal free fluid. Musculoskeletal:  Unremarkable. IMPRESSION: Decreased overall uterine volume and size of individual fibroids since prior study. No residual fibroid contrast enhancement. Electronically Signed   By: Earle Gell M.D.   On: 02/27/2017 11:02    Labs:  CBC: Recent Labs    08/07/16 1238  WBC 7.1  HGB 8.9*  HCT 28.4*  PLT 357    COAGS: Recent Labs    08/07/16 1238  INR 1.01    BMP: Recent Labs    08/07/16 1238  NA 140  K 3.7  CL 107  CO2 23  GLUCOSE 99  BUN 9  CALCIUM 9.4  CREATININE 0.64  GFRNONAA >60  GFRAA >60    LIVER FUNCTION TESTS: Recent Labs    08/07/16 1238  BILITOT 0.5  AST 18  ALT 13*  ALKPHOS 52  PROT 8.0  ALBUMIN 4.2     Assessment and Plan:  6 month status post uterine fibroid embolization procedure. Symptoms have resolved. No residual abnormal bleeding, pelvic pain, or dyspareunia. No delay complication. MRI shows  reduction in uterine size and all of the fibroids. No residual fibroid enhancement. Her MRI findings are concordant with her clinical outcome.  Plan: Continue annual outpatient follow-up with her OB/GYN doctor. Follow-up with [place] as needed.  Electronically Signed: Greggory Keen 02/27/2017, 11:44 AM   I spent a total of    25 Minutes in face to face in clinical consultation, greater than 50% of which was counseling/coordinating care for this patient status post [place]

## 2017-02-27 NOTE — Progress Notes (Signed)
LMP:  02/25/2017.  Frequency:  q 28 days.  Length:  7-8 days light spotting.  No clots.  Bulk symptoms have greatly improved.  Dyspareunia has resolved.  Patient will schedule routine follow up app't w/ Dr Garwin Brothers.    Karizma Cheek Riki Rusk, RN 02/27/2017 11:18 AM

## 2018-02-20 ENCOUNTER — Other Ambulatory Visit: Payer: Self-pay | Admitting: Family Medicine

## 2018-02-20 ENCOUNTER — Ambulatory Visit
Admission: RE | Admit: 2018-02-20 | Discharge: 2018-02-20 | Disposition: A | Discharge status: Home / Self Care | Payer: BLUE CROSS/BLUE SHIELD | Source: Ambulatory Visit | Attending: Family Medicine | Admitting: Family Medicine

## 2018-02-20 DIAGNOSIS — M1990 Unspecified osteoarthritis, unspecified site: Secondary | ICD-10-CM

## 2018-11-27 DIAGNOSIS — I10 Essential (primary) hypertension: Secondary | ICD-10-CM | POA: Diagnosis present

## 2019-09-24 ENCOUNTER — Other Ambulatory Visit: Payer: Self-pay

## 2019-09-24 ENCOUNTER — Ambulatory Visit
Admission: EM | Admit: 2019-09-24 | Discharge: 2019-09-24 | Disposition: A | Discharge status: Home / Self Care | Payer: 59 | Attending: Physician Assistant | Admitting: Physician Assistant

## 2019-09-24 DIAGNOSIS — H0102B Squamous blepharitis left eye, upper and lower eyelids: Secondary | ICD-10-CM

## 2019-09-24 DIAGNOSIS — H0102A Squamous blepharitis right eye, upper and lower eyelids: Secondary | ICD-10-CM

## 2019-09-24 DIAGNOSIS — L02419 Cutaneous abscess of limb, unspecified: Secondary | ICD-10-CM

## 2019-09-24 MED ORDER — FLUCONAZOLE 150 MG PO TABS: 150.0000 mg | ORAL_TABLET | Freq: Every day | ORAL | 0 refills | Status: DC

## 2019-09-24 MED ORDER — CEPHALEXIN 500 MG PO CAPS: 500.0000 mg | ORAL_CAPSULE | Freq: Four times a day (QID) | ORAL | 0 refills | Status: DC

## 2019-09-24 NOTE — ED Triage Notes (Addendum)
Pt c/o abscess to bilateral axilla for approx 3 days. Denies drainage from sites. Also reports change in vision, irritation and redness to bilateral eyes; left greater than right since yesterday, with swelling to bilateral upper lids, facial/eye pressure. Reports dried exudate upon waking.  Denies fever, chills, congestion, runny nose, ear pain, cough.

## 2019-09-24 NOTE — ED Provider Notes (Signed)
[facility]    CSN: [id number] Arrival date & time: 09/24/19  0805      History   Chief Complaint Chief Complaint  Patient presents with  . Eye Problem  . Abscess    HPI Sierra King is a 44 y.o. female.   44 year old female comes in for multiple complaints.  1. 3 day history of possible abscess bilateral axilla. Had just shaved. Denies increase in size. No fevers. Boil ease without relief.  2. Few day history of bilateral eye irritation with some swelling to the eyelids that area waxing and waning. Intermittent vision changes. Now with eye redness and dry crusting. Denies contact lens use, glasses use. Denies URI symptoms.     Past Medical History:  Diagnosis Date  . Allergy   . Anemia   . Arthritis   . Fibroids    [place] 08/07/2016  . Hypertension     Patient Active Problem List   Diagnosis Date Noted  . Fibroids 08/07/2016    Past Surgical History:  Procedure Laterality Date  . BREAST LUMPECTOMY Right   . CESAREAN SECTION    . IR ANGIOGRAM PELVIS SELECTIVE OR SUPRASELECTIVE  08/07/2016  . IR ANGIOGRAM PELVIS SELECTIVE OR SUPRASELECTIVE  08/07/2016  . IR ANGIOGRAM SELECTIVE EACH ADDITIONAL VESSEL  08/07/2016  . IR ANGIOGRAM SELECTIVE EACH ADDITIONAL VESSEL  08/07/2016  . IR EMBO TUMOR ORGAN ISCHEMIA INFARCT INC GUIDE ROADMAPPING  08/07/2016  . IR RADIOLOGIST EVAL & MGMT  05/16/2016  . IR RADIOLOGIST EVAL & MGMT  09/12/2016  . IR RADIOLOGIST EVAL & MGMT  02/27/2017  . IR US GUIDE VASC ACCESS LEFT  08/07/2016  . IR US GUIDE VASC ACCESS RIGHT  08/07/2016  . UTERINE ARTERY EMBOLIZATION  08/07/2016  . WISDOM TOOTH EXTRACTION      OB History   No obstetric history on file.      Home Medications    Prior to Admission medications   Medication Sig Start Date End Date Taking? Authorizing Provider  amLODipine (NORVASC) 10 MG tablet Take 10 mg by mouth daily.   Yes [provider]  telmisartan-hydrochlorothiazide (MICARDIS HCT) 40-12.5 MG tablet Take  1 tablet by mouth daily.   Yes [provider]  cephALEXin (KEFLEX) 500 MG capsule Take 1 capsule (500 mg total) by mouth 4 (four) times daily. 09/24/19   Tasia Catchings, Nyellie Yetter V, PA-C  fluconazole (DIFLUCAN) 150 MG tablet Take 1 tablet (150 mg total) by mouth daily. Take second dose 72 hours later if symptoms still persists. 09/24/19   Ok Edwards, PA-C    Family History Family History  Problem Relation Age of Onset  . Diabetes Mother   . Hypertension Mother   . Diabetes Father   . Hypertension Father     Social History Social History   Tobacco Use  . Smoking status: Never Smoker  . Smokeless tobacco: Never Used  Vaping Use  . Vaping Use: Never used  Substance Use Topics  . Alcohol use: Yes    Comment: occasional glass of wine or beer  . Drug use: Yes    Types: Marijuana    Comment: OCASSIONAL     Allergies   Latex   Review of Systems Review of Systems  Reason unable to perform ROS: See HPI as above.     Physical Exam Triage Vital Signs ED Triage Vitals  Enc Vitals Group     BP 09/24/19 0824 (!) 152/112     Pulse Rate 09/24/19 0824 80  Resp 09/24/19 0824 18     Temp 09/24/19 0824 98.3 F (36.8 C)     Temp Source 09/24/19 0824 Oral     SpO2 09/24/19 0824 98 %     Weight --      Height --      Head Circumference --      Peak Flow --      Pain Score 09/24/19 0826 6     Pain Loc --      Pain Edu? --      Excl. in [place]? --    No data found.  Updated Vital Signs BP (!) 152/112 (BP Location: Left Wrist) Comment: has not taken bp meds today  Pulse 80   Temp 98.3 F (36.8 C) (Oral)   Resp 18   LMP 09/13/2019   SpO2 98%   Visual Acuity Right Eye Distance:   Left Eye Distance:   Bilateral Distance:    Right Eye Near: R Near: 20/25 Left Eye Near:  L Near: 20/30 Bilateral Near:  20/25  Physical Exam Constitutional:      General: She is not in acute distress.    Appearance: She is well-developed. She is not ill-appearing, toxic-appearing or diaphoretic.    HENT:     Head: Normocephalic and atraumatic.  Eyes:     Pupils: Pupils are equal, round, and reactive to light.     Comments: Mild swelling to bilateral upper and lower eyelid without erythema, warmth. No tenderness to palpation. Right eye with conjunctival injection inferiorly. Left eye with conjunctival injection temporally. No active drainage. No photophobia. EOMI.   Skin:    Comments: Left axilla without abscess, folliculitis, cellulitis.  Right axilla with 0.3cm abscess without surrounding cellulitis.   Neurological:     Mental Status: She is alert and oriented to person, place, and time.      UC Treatments / Results  Labs (all labs ordered are listed, but only abnormal results are displayed) Labs Reviewed - No data to display  EKG   Radiology No results found.  Procedures Procedures (including critical care time)  Medications Ordered in UC Medications - No data to display  Initial Impression / Assessment and Plan / UC Course  I have reviewed the triage vital signs and the nursing notes.  Pertinent labs & imaging results that were available during my care of the patient were reviewed by me and considered in my medical decision making (see chart for details).    1. Blepharitis Artificial tears gel as directed. Lid scrubs and warm compresses as directed. Patient to follow up with ophthalmology if symptoms worsens or does not improve. Return precautions given.   2. Axilla abscess Given small size, discussed I&D vs abx. Risks and benefits discussed, including need to return for I&D. Patient would like to defer I&D for now. Will start keflex and warm compress. Return precautions given.  Patient expresses understanding and agrees to plan.  Final Clinical Impressions(s) / UC Diagnoses   Final diagnoses:  Squamous blepharitis of upper and lower eyelids of both eyes  Axillary abscess    ED Prescriptions    Medication Sig Dispense Auth. Provider   cephALEXin  (KEFLEX) 500 MG capsule Take 1 capsule (500 mg total) by mouth 4 (four) times daily. 28 capsule Edith Lord V, PA-C   fluconazole (DIFLUCAN) 150 MG tablet Take 1 tablet (150 mg total) by mouth daily. Take second dose 72 hours later if symptoms still persists. 2 tablet Ok Edwards, PA-C  PDMP not reviewed this encounter.   Ok Edwards, PA-C 09/24/19 [phone]

## 2019-09-24 NOTE — Discharge Instructions (Signed)
Blepharitis (dry eyes) Artificial tear gel (systane, genteal) as needed. Lid scrubs and warm compresses as directed. Monitor for any worsening of symptoms, changes in vision, sensitivity to light, eye swelling, painful eye movement, follow up with ophthalmology for further evaluation.   Abscess  Start keflex as directed. Warm compresses as directed. If still with swelling, increasing in size/redness, may need drainage.

## 2019-09-28 ENCOUNTER — Encounter: Payer: Self-pay | Admitting: Emergency Medicine

## 2019-09-28 ENCOUNTER — Other Ambulatory Visit: Payer: Self-pay

## 2019-09-28 ENCOUNTER — Telehealth: Payer: Self-pay | Admitting: Emergency Medicine

## 2019-09-28 ENCOUNTER — Ambulatory Visit
Admission: EM | Admit: 2019-09-28 | Discharge: 2019-09-28 | Disposition: A | Discharge status: Home / Self Care | Payer: 59 | Attending: Emergency Medicine | Admitting: Emergency Medicine

## 2019-09-28 ENCOUNTER — Ambulatory Visit (INDEPENDENT_AMBULATORY_CARE_PROVIDER_SITE_OTHER): Payer: 59

## 2019-09-28 DIAGNOSIS — M25562 Pain in left knee: Secondary | ICD-10-CM

## 2019-09-28 DIAGNOSIS — M25462 Effusion, left knee: Secondary | ICD-10-CM | POA: Diagnosis not present

## 2019-09-28 MED ORDER — PREDNISONE 10 MG (21) PO TBPK: ORAL_TABLET | Freq: Every day | ORAL | 0 refills | Status: DC

## 2019-09-28 NOTE — Telephone Encounter (Signed)
Pt husband called about RX not being sent; to have provider send

## 2019-09-28 NOTE — Discharge Instructions (Addendum)
RICE: rest, ice, compression, elevation as needed for pain.   Cold therapy (ice packs) can be used to help swelling both after injury and after prolonged use of areas of chronic pain/aches.  Pain medication:  Prednisone with breakfast as directed: 6-5-4-3-2-1  Important to follow up with specialist(s) below for further evaluation/management if your symptoms persist or worsen.

## 2019-09-28 NOTE — Telephone Encounter (Signed)
pred pack sent

## 2019-09-28 NOTE — ED Provider Notes (Signed)
[facility]    CSN: [id number] Arrival date & time: 09/28/19  0932      History   Chief Complaint Chief Complaint  Patient presents with  . Knee Pain    HPI Sierra King is a 44 y.o. female  With history of hypertension, obesity presenting for left knee pain with swelling, warmth x4 days.  Patient denies change in diet, lifestyle, medications.  Does work on her feet a lot and has had increased hours.  No fall, trauma, deformity, bruising.  Use knee brace with some relief.  Past Medical History:  Diagnosis Date  . Allergy   . Anemia   . Arthritis   . Fibroids    [place] 08/07/2016  . Hypertension     Patient Active Problem List   Diagnosis Date Noted  . Fibroids 08/07/2016    Past Surgical History:  Procedure Laterality Date  . BREAST LUMPECTOMY Right   . CESAREAN SECTION    . IR ANGIOGRAM PELVIS SELECTIVE OR SUPRASELECTIVE  08/07/2016  . IR ANGIOGRAM PELVIS SELECTIVE OR SUPRASELECTIVE  08/07/2016  . IR ANGIOGRAM SELECTIVE EACH ADDITIONAL VESSEL  08/07/2016  . IR ANGIOGRAM SELECTIVE EACH ADDITIONAL VESSEL  08/07/2016  . IR EMBO TUMOR ORGAN ISCHEMIA INFARCT INC GUIDE ROADMAPPING  08/07/2016  . IR RADIOLOGIST EVAL & MGMT  05/16/2016  . IR RADIOLOGIST EVAL & MGMT  09/12/2016  . IR RADIOLOGIST EVAL & MGMT  02/27/2017  . IR US GUIDE VASC ACCESS LEFT  08/07/2016  . IR US GUIDE VASC ACCESS RIGHT  08/07/2016  . UTERINE ARTERY EMBOLIZATION  08/07/2016  . WISDOM TOOTH EXTRACTION      OB History   No obstetric history on file.      Home Medications    Prior to Admission medications   Medication Sig Start Date End Date Taking? Authorizing Provider  amLODipine (NORVASC) 10 MG tablet Take 10 mg by mouth daily.    [provider]  cephALEXin (KEFLEX) 500 MG capsule Take 1 capsule (500 mg total) by mouth 4 (four) times daily. 09/24/19   Tasia Catchings, Amy V, PA-C  fluconazole (DIFLUCAN) 150 MG tablet Take 1 tablet (150 mg total) by mouth daily. Take second dose 72 hours later  if symptoms still persists. 09/24/19   Tasia Catchings, Amy V, PA-C  telmisartan-hydrochlorothiazide (MICARDIS HCT) 40-12.5 MG tablet Take 1 tablet by mouth daily.    [provider]    Family History Family History  Problem Relation Age of Onset  . Diabetes Mother   . Hypertension Mother   . Diabetes Father   . Hypertension Father     Social History Social History   Tobacco Use  . Smoking status: Never Smoker  . Smokeless tobacco: Never Used  Vaping Use  . Vaping Use: Never used  Substance Use Topics  . Alcohol use: Yes    Comment: occasional glass of wine or beer  . Drug use: Yes    Types: Marijuana    Comment: OCASSIONAL     Allergies   Latex   Review of Systems As per HPI   Physical Exam Triage Vital Signs ED Triage Vitals [09/28/19 1013]  Enc Vitals Group     BP 117/81     Pulse Rate (!) 115     Resp 18     Temp 99.3 F (37.4 C)     Temp Source Oral     SpO2 97 %     Weight      Height  Head Circumference      Peak Flow      Pain Score      Pain Loc      Pain Edu?      Excl. in [place]?    No data found.  Updated Vital Signs BP 117/81 (BP Location: Left Arm)   Pulse (!) 110   Temp 99.3 F (37.4 C) (Oral)   Resp 18   LMP 09/13/2019   SpO2 97%   Visual Acuity Right Eye Distance:   Left Eye Distance:   Bilateral Distance:    Right Eye Near:   Left Eye Near:    Bilateral Near:     Physical Exam Constitutional:      General: She is not in acute distress. HENT:     Head: Normocephalic and atraumatic.  Eyes:     General: No scleral icterus.    Pupils: Pupils are equal, round, and reactive to light.  Cardiovascular:     Rate and Rhythm: Normal rate.  Pulmonary:     Effort: Pulmonary effort is normal.  Musculoskeletal:        General: Swelling and tenderness present. Normal range of motion.     Comments: Exam limited second to patient habitus: L knee is neurovascularly intact without appreciable cord.  Negative Homans' sign.  Patella  stable.  Mild warmth.  No crepitus.  Skin:    Coloration: Skin is not jaundiced or pale.  Neurological:     Mental Status: She is alert and oriented to person, place, and time.      UC Treatments / Results  Labs (all labs ordered are listed, but only abnormal results are displayed) Labs Reviewed - No data to display  EKG   Radiology DG Knee Complete 4 Views Left  Result Date: 09/28/2019 CLINICAL DATA:  Knee pain, swelling. EXAM: LEFT KNEE - COMPLETE 4+ VIEW COMPARISON:  02/20/2018 FINDINGS: Large suprapatellar joint effusion identified. Moderate tricompartment osteoarthritis identified with joint space narrowing, marginal spur formation and sharpening of the tibial spines. No acute fracture or dislocation identified. IMPRESSION: 1. Large suprapatellar joint effusion. 2. Moderate tricompartment osteoarthritis. Electronically Signed   By: Kerby Moors M.D.   On: 09/28/2019 11:03    Procedures Procedures (including critical care time)  Medications Ordered in UC Medications - No data to display  Initial Impression / Assessment and Plan / UC Course  I have reviewed the triage vital signs and the nursing notes.  Pertinent labs & imaging results that were available during my care of the patient were reviewed by me and considered in my medical decision making (see chart for details).     Patient without trauma or injury, they are requesting x-ray.  This is done office, reviewed by me radiology: Large suprapatellar joint effusion with moderate tricompartment osteoarthritis.  Will start prednisone taper, follow-up with orthopedics.  Work note provided.  Return precautions discussed, pt verbalized understanding and is agreeable to plan. Final Clinical Impressions(s) / UC Diagnoses   Final diagnoses:  Acute pain of left knee  Knee effusion, left     Discharge Instructions     RICE: rest, ice, compression, elevation as needed for pain.   Cold therapy (ice packs) can be used to  help swelling both after injury and after prolonged use of areas of chronic pain/aches.  Pain medication:  Prednisone with breakfast as directed: 6-5-4-3-2-1  Important to follow up with specialist(s) below for further evaluation/management if your symptoms persist or worsen.    ED Prescriptions  None     PDMP not reviewed this encounter.   Hall[place]Potvin, [place], [place] 09/28/19 1114

## 2019-09-28 NOTE — ED Triage Notes (Signed)
Pt here for knee pain with swelling x 4 days; pt sts increased pain and swelling with warmth

## 2021-06-20 ENCOUNTER — Ambulatory Visit (INDEPENDENT_AMBULATORY_CARE_PROVIDER_SITE_OTHER): Payer: 59

## 2021-06-20 ENCOUNTER — Ambulatory Visit (INDEPENDENT_AMBULATORY_CARE_PROVIDER_SITE_OTHER): Payer: 59 | Admitting: Sports Medicine

## 2021-06-20 DIAGNOSIS — M170 Bilateral primary osteoarthritis of knee: Secondary | ICD-10-CM | POA: Diagnosis not present

## 2021-06-20 DIAGNOSIS — M25511 Pain in right shoulder: Secondary | ICD-10-CM | POA: Diagnosis not present

## 2021-06-20 DIAGNOSIS — M7541 Impingement syndrome of right shoulder: Secondary | ICD-10-CM | POA: Diagnosis not present

## 2021-06-20 MED ORDER — MELOXICAM 15 MG PO TABS: ORAL_TABLET | ORAL | 3 refills | Status: DC

## 2021-06-20 NOTE — Assessment & Plan Note (Signed)
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 We will start conservatively with x-rays, meloxicam, home conditioning, return to see me in 4 to 6 weeks, we will consider subacromial injection if not better. We did discuss the anatomy and pathophysiology of impingement syndrome.

## 2021-06-20 NOTE — Assessment & Plan Note (Signed)
Known bilateral knee osteoarthritis noted on multiple x-rays over the past several years, medial joint line pain with gelling, swelling. Starting conservatively, Mobic, home conditioning. She is working with a weight loss clinic as well. Return to see me in about 4 weeks for consideration of injections if not better.

## 2021-06-20 NOTE — Progress Notes (Signed)
    Procedures performed today:    None.  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 We will start conservatively with x-rays, meloxicam, home conditioning, return to see me in 4 to 6 weeks, we will consider subacromial injection if not better. We did discuss the anatomy and pathophysiology of impingement syndrome.  Primary osteoarthritis of both knees Known bilateral knee osteoarthritis noted on multiple x-rays over the past several years, medial joint line pain with gelling, swelling. Starting conservatively, Mobic, home conditioning. She is working with a weight loss clinic as well. Return to see me in about 4 weeks for consideration of injections if not better.  Chronic process with exacerbation and pharmacologic intervention  ___________________________________________ Gwen Her. Dianah Field, M.D., ABFM., CAQSM. Primary Care and [place] Instructor of [employer] [facility] [employer]

## 2021-07-19 ENCOUNTER — Ambulatory Visit: Payer: 59 | Admitting: Sports Medicine

## 2021-08-24 ENCOUNTER — Ambulatory Visit (INDEPENDENT_AMBULATORY_CARE_PROVIDER_SITE_OTHER): Payer: 59 | Admitting: Sports Medicine

## 2021-08-24 ENCOUNTER — Ambulatory Visit (INDEPENDENT_AMBULATORY_CARE_PROVIDER_SITE_OTHER): Payer: 59

## 2021-08-24 DIAGNOSIS — M170 Bilateral primary osteoarthritis of knee: Secondary | ICD-10-CM

## 2021-08-24 DIAGNOSIS — M7541 Impingement syndrome of right shoulder: Secondary | ICD-10-CM | POA: Diagnosis not present

## 2021-08-24 NOTE — Assessment & Plan Note (Signed)
Pleasant 45 year old female, persistent right shoulder pain, impingement in nature. X-rays did show acromioclavicular osteoarthritis. Because she has failed conservative treatment we will proceed with a subacromial injection today. Return to see me in 4 to 6 weeks

## 2021-08-24 NOTE — Progress Notes (Signed)
    Procedures performed today:    Procedure: Real-time Ultrasound Guided injection of the right subacromial bursa Device: Samsung HS60  Verbal informed consent obtained.  Time-out conducted.  Noted no overlying erythema, induration, or other signs of local infection.  Skin prepped in a sterile fashion.  Local anesthesia: Topical Ethyl chloride.  With sterile technique and under real time ultrasound guidance: Noted intact cuff, 1 cc Kenalog 40, 1 cc lidocaine, 1 cc bupivacaine injected easily Completed without difficulty  Advised to call if fevers/chills, erythema, induration, drainage, or persistent bleeding.  Images permanently stored and available for review in PACS.  Impression: Technically successful ultrasound guided injection.  Independent interpretation of notes and tests performed by another provider:   None.  Brief History, Exam, Impression, and Recommendations:    Impingement syndrome, shoulder, right Pleasant 46 year old female, persistent right shoulder pain, impingement in nature. X-rays did show acromioclavicular osteoarthritis. Because she has failed conservative treatment we will proceed with a subacromial injection today. Return to see me in 4 to 6 weeks  Primary osteoarthritis of both knees X-ray confirmed osteoarthritis, knees have improved considerably with aquatic therapy, meloxicam, she is continuing to work on weight loss. At this point her knees do not bother her enough to consider an injection so we will watch this for now.    ____________________________________________ Gwen Her. Dianah Field, M.D., ABFM., CAQSM., AME. Primary Care and Sports Medicine [facility]  Adjunct Professor of [place] [employer] [facility] [employer]  Risk manager

## 2021-08-24 NOTE — Assessment & Plan Note (Signed)
X-ray confirmed osteoarthritis, knees have improved considerably with aquatic therapy, meloxicam, she is continuing to work on weight loss. At this point her knees do not bother her enough to consider an injection so we will watch this for now.

## 2021-08-25 DIAGNOSIS — Z01419 Encounter for gynecological examination (general) (routine) without abnormal findings: Secondary | ICD-10-CM | POA: Diagnosis not present

## 2021-08-25 DIAGNOSIS — N644 Mastodynia: Secondary | ICD-10-CM | POA: Diagnosis not present

## 2021-09-21 DIAGNOSIS — Z1231 Encounter for screening mammogram for malignant neoplasm of breast: Secondary | ICD-10-CM | POA: Diagnosis not present

## 2021-10-05 ENCOUNTER — Ambulatory Visit: Payer: 59 | Admitting: Sports Medicine

## 2021-10-10 ENCOUNTER — Encounter (HOSPITAL_COMMUNITY): Payer: Self-pay | Admitting: Emergency Medicine

## 2021-10-10 ENCOUNTER — Other Ambulatory Visit: Payer: Self-pay

## 2021-10-10 ENCOUNTER — Ambulatory Visit (HOSPITAL_COMMUNITY)
Admission: EM | Admit: 2021-10-10 | Discharge: 2021-10-10 | Disposition: A | Discharge status: Home / Self Care | Payer: 59 | Attending: Family Medicine | Admitting: Family Medicine

## 2021-10-10 ENCOUNTER — Encounter (HOSPITAL_COMMUNITY): Payer: Self-pay

## 2021-10-10 ENCOUNTER — Observation Stay (HOSPITAL_COMMUNITY)
Admission: EM | Admit: 2021-10-10 | Discharge: 2021-10-12 | Disposition: A | Discharge status: Home / Self Care | Payer: 59 | Attending: Student in an Organized Health Care Education/Training Program | Admitting: Student in an Organized Health Care Education/Training Program

## 2021-10-10 DIAGNOSIS — E1310 Other specified diabetes mellitus with ketoacidosis without coma: Secondary | ICD-10-CM

## 2021-10-10 DIAGNOSIS — E873 Alkalosis: Secondary | ICD-10-CM

## 2021-10-10 DIAGNOSIS — E1110 Type 2 diabetes mellitus with ketoacidosis without coma: Principal | ICD-10-CM

## 2021-10-10 DIAGNOSIS — N179 Acute kidney failure, unspecified: Secondary | ICD-10-CM | POA: Diagnosis not present

## 2021-10-10 DIAGNOSIS — E872 Acidosis, unspecified: Secondary | ICD-10-CM | POA: Diagnosis not present

## 2021-10-10 DIAGNOSIS — I10 Essential (primary) hypertension: Secondary | ICD-10-CM | POA: Diagnosis not present

## 2021-10-10 DIAGNOSIS — Z79899 Other long term (current) drug therapy: Secondary | ICD-10-CM | POA: Insufficient documentation

## 2021-10-10 DIAGNOSIS — Z91040 Latex allergy status: Secondary | ICD-10-CM | POA: Diagnosis not present

## 2021-10-10 DIAGNOSIS — R739 Hyperglycemia, unspecified: Secondary | ICD-10-CM

## 2021-10-10 DIAGNOSIS — E1165 Type 2 diabetes mellitus with hyperglycemia: Secondary | ICD-10-CM | POA: Diagnosis present

## 2021-10-10 DIAGNOSIS — Z794 Long term (current) use of insulin: Secondary | ICD-10-CM

## 2021-10-10 DIAGNOSIS — R000 Tachycardia, unspecified: Secondary | ICD-10-CM | POA: Diagnosis not present

## 2021-10-10 DIAGNOSIS — E119 Type 2 diabetes mellitus without complications: Secondary | ICD-10-CM

## 2021-10-10 LAB — BASIC METABOLIC PANEL
Anion gap: 23 — ABNORMAL HIGH (ref 5–15)
Anion gap: 13 (ref 5–15)
BUN: 29 mg/dL — ABNORMAL HIGH (ref 6–20)
BUN: 23 mg/dL — ABNORMAL HIGH (ref 6–20)
CO2: 15 mmol/L — ABNORMAL LOW (ref 22–32)
CO2: 23 mmol/L (ref 22–32)
Calcium: 10 mg/dL (ref 8.9–10.3)
Calcium: 10 mg/dL (ref 8.9–10.3)
Chloride: 93 mmol/L — ABNORMAL LOW (ref 98–111)
Chloride: 103 mmol/L (ref 98–111)
Creatinine, Ser: 1.63 mg/dL — ABNORMAL HIGH (ref 0.44–1.00)
Creatinine, Ser: 1.14 mg/dL — ABNORMAL HIGH (ref 0.44–1.00)
GFR, Estimated: 39 mL/min — ABNORMAL LOW (ref >60)
GFR, Estimated: >60 mL/min (ref >60)
Glucose, Bld: 705 mg/dL (ref 70–99)
Glucose, Bld: 203 mg/dL — ABNORMAL HIGH (ref 70–99)
Potassium: 5.6 mmol/L — ABNORMAL HIGH (ref 3.5–5.1)
Potassium: 3.8 mmol/L (ref 3.5–5.1)
Sodium: 131 mmol/L — ABNORMAL LOW (ref 135–145)
Sodium: 139 mmol/L (ref 135–145)

## 2021-10-10 LAB — I-STAT VENOUS BLOOD GAS, ED
Acid-base deficit: 7 mmol/L — ABNORMAL HIGH (ref 0.0–2.0)
Bicarbonate: 15.2 mmol/L — ABNORMAL LOW (ref 20.0–28.0)
Calcium, Ion: 1.04 mmol/L — ABNORMAL LOW (ref 1.15–1.40)
HCT: 44 % (ref 36.0–46.0)
Hemoglobin: 15 g/dL (ref 12.0–15.0)
O2 Saturation: 82 %
Potassium: 5.9 mmol/L — ABNORMAL HIGH (ref 3.5–5.1)
Sodium: 128 mmol/L — ABNORMAL LOW (ref 135–145)
TCO2: 16 mmol/L — ABNORMAL LOW (ref 22–32)
pCO2, Ven: 23.1 mmHg — ABNORMAL LOW (ref 44–60)
pH, Ven: 7.426 (ref 7.25–7.43)
pO2, Ven: 44 mmHg (ref 32–45)

## 2021-10-10 LAB — CBC
HCT: 43.1 % (ref 36.0–46.0)
HCT: 36 % (ref 36.0–46.0)
Hemoglobin: 14.2 g/dL (ref 12.0–15.0)
Hemoglobin: 12.7 g/dL (ref 12.0–15.0)
MCH: 30.8 pg (ref 26.0–34.0)
MCH: 31.3 pg (ref 26.0–34.0)
MCHC: 32.9 g/dL (ref 30.0–36.0)
MCHC: 35.3 g/dL (ref 30.0–36.0)
MCV: 93.5 fL (ref 80.0–100.0)
MCV: 88.7 fL (ref 80.0–100.0)
Platelets: 262 10*3/uL (ref 150–400)
Platelets: 253 10*3/uL (ref 150–400)
RBC: 4.61 MIL/uL (ref 3.87–5.11)
RBC: 4.06 MIL/uL (ref 3.87–5.11)
RDW: 13 % (ref 11.5–15.5)
RDW: 12.9 % (ref 11.5–15.5)
WBC: 10.5 10*3/uL (ref 4.0–10.5)
WBC: 9.8 10*3/uL (ref 4.0–10.5)
nRBC: 0 % (ref 0.0–0.2)
nRBC: 0 % (ref 0.0–0.2)

## 2021-10-10 LAB — OSMOLALITY: Osmolality: 313 mOsm/kg — ABNORMAL HIGH (ref 275–295)

## 2021-10-10 LAB — CBG MONITORING, ED
Glucose-Capillary: >600 mg/dL (ref 70–99)
Glucose-Capillary: >600 mg/dL (ref 70–99)
Glucose-Capillary: 540 mg/dL (ref 70–99)
Glucose-Capillary: 455 mg/dL — ABNORMAL HIGH (ref 70–99)
Glucose-Capillary: 370 mg/dL — ABNORMAL HIGH (ref 70–99)
Glucose-Capillary: 217 mg/dL — ABNORMAL HIGH (ref 70–99)

## 2021-10-10 LAB — URINALYSIS, ROUTINE W REFLEX MICROSCOPIC
Bacteria, UA: NONE SEEN
Bilirubin Urine: NEGATIVE
Glucose, UA: >=500 mg/dL — AB
Ketones, ur: 80 mg/dL — AB
Leukocytes,Ua: NEGATIVE
Nitrite: NEGATIVE
Protein, ur: NEGATIVE mg/dL
Specific Gravity, Urine: 1.025 (ref 1.005–1.030)
pH: 5 (ref 5.0–8.0)

## 2021-10-10 LAB — GLUCOSE, CAPILLARY
Glucose-Capillary: 218 mg/dL — ABNORMAL HIGH (ref 70–99)
Glucose-Capillary: 255 mg/dL — ABNORMAL HIGH (ref 70–99)

## 2021-10-10 LAB — POCT URINALYSIS DIPSTICK, ED / UC
Bilirubin Urine: NEGATIVE
Glucose, UA: 500 mg/dL — AB
Ketones, ur: >=160 mg/dL — AB
Leukocytes,Ua: NEGATIVE
Nitrite: NEGATIVE
Protein, ur: NEGATIVE mg/dL
Specific Gravity, Urine: 1.01 (ref 1.005–1.030)
Urobilinogen, UA: 0.2 mg/dL (ref 0.0–1.0)
pH: 5 (ref 5.0–8.0)

## 2021-10-10 LAB — HEMOGLOBIN A1C
Hgb A1c MFr Bld: 12.9 % — ABNORMAL HIGH (ref 4.8–5.6)
Mean Plasma Glucose: 323.53 mg/dL

## 2021-10-10 LAB — I-STAT BETA HCG BLOOD, ED (MC, WL, AP ONLY): I-stat hCG, quantitative: <5 m[IU]/mL (ref <5)

## 2021-10-10 LAB — BETA-HYDROXYBUTYRIC ACID: Beta-Hydroxybutyric Acid: >8 mmol/L — ABNORMAL HIGH (ref 0.05–0.27)

## 2021-10-10 MED ORDER — ENOXAPARIN SODIUM 40 MG/0.4ML IJ SOSY
40.0000 mg | PREFILLED_SYRINGE | INTRAMUSCULAR | Status: DC
  Administered 2021-10-10 – 2021-10-11 (×2): 40 mg via SUBCUTANEOUS
  Filled 2021-10-10 (×2): qty 0.4

## 2021-10-10 MED ORDER — DEXTROSE IN LACTATED RINGERS 5 % IV SOLN: INTRAVENOUS | Status: DC

## 2021-10-10 MED ORDER — LACTATED RINGERS IV BOLUS
2000.0000 mL | Freq: Once | INTRAVENOUS | Status: AC
  Administered 2021-10-10: 2000 mL via INTRAVENOUS

## 2021-10-10 MED ORDER — ACETAMINOPHEN 325 MG PO TABS
650.0000 mg | ORAL_TABLET | Freq: Four times a day (QID) | ORAL | Status: DC | PRN
  Administered 2021-10-11: 650 mg via ORAL
  Filled 2021-10-10: qty 2

## 2021-10-10 MED ORDER — INSULIN REGULAR(HUMAN) IN NACL 100-0.9 UT/100ML-% IV SOLN
INTRAVENOUS | Status: DC
  Administered 2021-10-10: 13 [IU]/h via INTRAVENOUS
  Administered 2021-10-11: 7.5 [IU]/h via INTRAVENOUS
  Filled 2021-10-10 (×2): qty 100

## 2021-10-10 MED ORDER — ONDANSETRON HCL 4 MG/2ML IJ SOLN
4.0000 mg | Freq: Once | INTRAMUSCULAR | Status: AC
  Administered 2021-10-10: 4 mg via INTRAVENOUS
  Filled 2021-10-10: qty 2

## 2021-10-10 MED ORDER — POTASSIUM CHLORIDE 10 MEQ/100ML IV SOLN
10.0000 meq | INTRAVENOUS | Status: AC
  Administered 2021-10-10 – 2021-10-11 (×3): 10 meq via INTRAVENOUS
  Filled 2021-10-10 (×3): qty 100

## 2021-10-10 MED ORDER — ACETAMINOPHEN 650 MG RE SUPP: 650.0000 mg | Freq: Four times a day (QID) | RECTAL | Status: DC | PRN

## 2021-10-10 MED ORDER — LACTATED RINGERS IV SOLN: INTRAVENOUS | Status: DC

## 2021-10-10 MED ORDER — DEXTROSE 50 % IV SOLN: 0.0000 mL | INTRAVENOUS | Status: DC | PRN

## 2021-10-10 NOTE — ED Triage Notes (Signed)
Pt reports for a week had vision that is blurry, saw eye doctor and got glasses. Reports is on weight loss program and lost 30 pounds over past 8-10 weeks. Pt very fatigued over past week as well.  When pregnant was pre-diabetic. Has HTN Pt c/o high buttock pains. Reports sits for work. When rubs herself feels sensitivity that started yesterday.

## 2021-10-10 NOTE — Discharge Instructions (Signed)
Labs Reviewed  POCT URINALYSIS DIPSTICK, ED / UC - Abnormal; Notable for the following components:      Result Value   Glucose, UA 500 (*)    Ketones, ur >=160 (*)    Hgb urine dipstick SMALL (*)    All other components within normal limits  CBG MONITORING, ED - Abnormal; Notable for the following components:   Glucose-Capillary >600 (*)    All other components within normal limits

## 2021-10-10 NOTE — ED Triage Notes (Signed)
Patient reports high blood sugar >600 reports has been feeling bad for a wekk. Reports blurry vision.  Also complains of fatigue.  Reports got new glasses but eyes still have not adjusted.

## 2021-10-10 NOTE — Hospital Course (Addendum)
New onset dm DKA? Hx gestation dm and gestational dm Past week increased fatigue, generalized weakness, blurry vision, polydipsia 705 and AG 23 Ketones in urine New aki Started on insulin drip and getting fluids

## 2021-10-10 NOTE — ED Notes (Signed)
EKG completed in triage signed by MD hard copy in triage

## 2021-10-10 NOTE — ED Provider Notes (Signed)
[facility] EMERGENCY DEPARTMENT Provider Note   CSN: [id number] Arrival date & time: 10/10/21  1321     History PMH: HTN, Anemia Chief Complaint  Patient presents with   Hyperglycemia    Sierra King is a 46 y.o. female. Patient is presenting with increased fatigue, generalized weakness, blurry vision, increased thirst, and increased urination for about a week and a half now.  She has associated nausea.  She states that she was diagnosed with prediabetes about 2 years ago.  She states that she did have gestational diabetes when she was pregnant in the past.  She is not on any medication for her diabetes.  She also endorses some recent weight loss because she is on a weight loss program that started about a month and a half ago.  She says she is mostly done dietary changes, but was feeling fine until the past week.  She denies any recent illness, including fever, chills, cough, chest pain, shortness of breath, abdominal pain, vomiting, diarrhea, flank pain, dysuria, hematuria.  Most recent A1c was at 5.7 in 2020    Hyperglycemia Associated symptoms: fatigue, increased thirst, nausea, polyuria and weakness   Associated symptoms: no abdominal pain, no chest pain, no fever, no shortness of breath and no vomiting        Home Medications Prior to Admission medications   Medication Sig Start Date End Date Taking? Authorizing Provider  amLODipine (NORVASC) 5 MG tablet Take 5 mg by mouth daily. 10/09/21  Yes [provider]  carboxymethylcellulose (REFRESH PLUS) 0.5 % SOLN Place 1 drop into both eyes daily.   Yes [provider]  hydrochlorothiazide (HYDRODIURIL) 25 MG tablet Take 25 mg by mouth daily. 10/09/21  Yes [provider]  ibuprofen (ADVIL) 200 MG tablet Take 200 mg by mouth every 6 (six) hours as needed for headache or mild pain.   Yes [provider]      Allergies    Latex    Review of Systems   Review of Systems   Constitutional:  Positive for fatigue and unexpected weight change. Negative for chills and fever.  HENT:  Negative for congestion and sore throat.   Eyes:  Positive for visual disturbance.  Respiratory:  Negative for cough and shortness of breath.   Cardiovascular:  Negative for chest pain and leg swelling.  Gastrointestinal:  Positive for nausea. Negative for abdominal pain and vomiting.  Endocrine: Positive for polydipsia and polyuria.  Neurological:  Positive for weakness.  All other systems reviewed and are negative.   Physical Exam Updated Vital Signs BP (!) 125/94   Pulse (!) 118   Temp 99.5 F (37.5 C) (Oral)   Resp 17   Ht '5\' 2"'$  (1.575 m)   Wt 94.3 kg   LMP 10/10/2021   SpO2 100%   BMI 38.04 kg/m  Physical Exam Vitals and nursing note reviewed.  Constitutional:      General: She is not in acute distress.    Appearance: Normal appearance. She is well-developed. She is not ill-appearing, toxic-appearing or diaphoretic.  HENT:     Head: Normocephalic and atraumatic.     Nose: No nasal deformity.     Mouth/Throat:     Lips: Pink. No lesions.     Mouth: Mucous membranes are dry.     Pharynx: Oropharynx is clear. No oropharyngeal exudate or posterior oropharyngeal erythema.  Eyes:     General: Gaze aligned appropriately. No scleral icterus.       Right  eye: No discharge.        Left eye: No discharge.     Conjunctiva/sclera: Conjunctivae normal.     Right eye: Right conjunctiva is not injected. No exudate or hemorrhage.    Left eye: Left conjunctiva is not injected. No exudate or hemorrhage. Cardiovascular:     Rate and Rhythm: Regular rhythm. Tachycardia present.     Pulses: Normal pulses.          Radial pulses are 2+ on the right side and 2+ on the left side.       Dorsalis pedis pulses are 2+ on the right side and 2+ on the left side.     Heart sounds: Normal heart sounds. No murmur heard.    No friction rub. No gallop.  Pulmonary:     Effort: Pulmonary  effort is normal. No respiratory distress.     Breath sounds: Normal breath sounds. No stridor. No wheezing, rhonchi or rales.  Abdominal:     General: Abdomen is flat. There is no distension.     Palpations: Abdomen is soft.     Tenderness: There is no abdominal tenderness. There is no right CVA tenderness, left CVA tenderness, guarding or rebound.  Musculoskeletal:     Right lower leg: No edema.     Left lower leg: No edema.  Skin:    General: Skin is warm and dry.  Neurological:     General: No focal deficit present.     Mental Status: She is alert and oriented to person, place, and time.  Psychiatric:        Mood and Affect: Mood normal.        Speech: Speech normal.        Behavior: Behavior normal. Behavior is cooperative.     ED Results / Procedures / Treatments   Labs (all labs ordered are listed, but only abnormal results are displayed) Labs Reviewed  BASIC METABOLIC PANEL - Abnormal; Notable for the following components:      Result Value   Sodium 131 (*)    Potassium 5.6 (*)    Chloride 93 (*)    CO2 15 (*)    Glucose, Bld 705 (*)    BUN 29 (*)    Creatinine, Ser 1.63 (*)    GFR, Estimated 39 (*)    Anion gap 23 (*)    All other components within normal limits  URINALYSIS, ROUTINE W REFLEX MICROSCOPIC - Abnormal; Notable for the following components:   Color, Urine STRAW (*)    Glucose, UA >=500 (*)    Hgb urine dipstick LARGE (*)    Ketones, ur 80 (*)    All other components within normal limits  CBG MONITORING, ED - Abnormal; Notable for the following components:   Glucose-Capillary >600 (*)    All other components within normal limits  CBC  BETA-HYDROXYBUTYRIC ACID  BETA-HYDROXYBUTYRIC ACID  BASIC METABOLIC PANEL  BASIC METABOLIC PANEL  BASIC METABOLIC PANEL  BASIC METABOLIC PANEL  OSMOLALITY  I-STAT BETA HCG BLOOD, ED ([facility], [facility], AP ONLY)  CBG MONITORING, ED  I-STAT VENOUS BLOOD GAS, ED    EKG None  Radiology No results  found.  Procedures Procedures  This patient was on telemetry or cardiac monitoring during their time in the ED.    Medications Ordered in ED Medications  lactated ringers bolus 2,000 mL (has no administration in time range)  ondansetron (ZOFRAN) injection 4 mg (has no administration in time range)  insulin regular, human (MYXREDLIN)  100 units/ 100 mL infusion (has no administration in time range)  lactated ringers infusion (has no administration in time range)  dextrose 5 % in lactated ringers infusion (has no administration in time range)  dextrose 50 % solution 0-50 mL (has no administration in time range)    ED Course/ Medical Decision Making/ A&P Clinical Course as of 10/10/21 1612  Mon Oct 10, 2021  1610 I discussed case with Internal Medicine Teaching Service who is agreeable to seeing patient for admission [GL]    Clinical Course User Index [GL] Sherre Poot Adora Fridge, PA-C                           Medical Decision Making Amount and/or Complexity of Data Reviewed Labs: ordered.  Risk Prescription drug management. Decision regarding hospitalization.    MDM  This is a 46 y.o. female who presents to the ED with hyperglycemia The differential of this patient includes but is not limited to new onset diabetes, DKA, HHS, infection  Initial Impression  Patient appears ill and looks clinically dehydrated She is tachycardic, afebrile, oxygenating, 100% on room air No signs of respiratory distress. She was sent here for an elevated undetectable glucose from urgent care.  I personally ordered, reviewed, and interpreted all laboratory work and imaging and agree with radiologist interpretation. Results interpreted below:  CBC is normal.  BMP reveals glucose of 705, CO2 of 15, anion gap of 23.  Sodium is 131, potassium 5.6.  Creatinine is 1.63 which is elevated from her most recent creatinine which was done 5 years ago. Urinalysis is notable for 80 ketones as well as large amount  of glucose Pregnancy test is negative  Assessment/Plan:  Patient is showing signs of hyperglycemic crisis in the setting of new onset diabetes.  Labs indicate that she is likely in DKA.  She has been given 2 L of LR, started on insulin drip, started on continuous LR infusion.  She has a BHB, VBG, and osmolality that are pending.  She also has presumed new AKI which will be treated with IV fluids.  Plan for admission to medicine.    Charting Requirements Additional history is obtained from:  Independent historian External Records from outside source obtained and reviewed including: Recent Urgent Care Note, prior A1C and creatinine Social Determinants of Health:  none Pertinant PMH that complicates patient's illness: n/a  Patient Care Problems that were addressed during this visit: - Diabetic Ketoacidosis: Acute illness with systemic symptoms - AKI: Acute illness with complication This patient was maintained on a cardiac monitor/telemetry. I personally viewed and interpreted the cardiac monitor which reveals an underlying rhythm of ST Medications given in ED: 2 L LR bolus, Insulin gtt, IV zofran Reevaluation of the patient after these medicines showed that the patient  stayed the same I have reviewed home medications and made changes accordingly.  Critical Care Interventions: DKA, started on insulin gtt Consultations: Internal Medicine Teaching Service Disposition: Admit  This is a supervised visit with my attending physician, Dr. Zenia Resides. We have discussed this patient and they have altered the plan as needed.  Portions of this note were generated with L[employer]ist. Dictation errors may occur despite best attempts at proofreading.    Final Clinical Impression(s) / ED Diagnoses Final diagnoses:  Diabetic ketoacidosis without coma associated with other specified diabetes mellitus ([place])  AKI (acute kidney injury) ([place])    Rx / DC Orders ED Discharge Orders  None          Sheila Oats 10/10/21 1613    Lacretia Leigh, MD 10/12/21 [phone]

## 2021-10-10 NOTE — ED Notes (Signed)
Patient is being discharged from the Urgent Care and sent to the Emergency Department via POV . Per Dr.Hagler, patient is in need of higher level of care due to glucose over 600 and ketones in urine. Patient is aware and verbalizes understanding of plan of care.  Vitals:   10/10/21 1225  BP: 117/83  Pulse: (!) 128  Resp: 18  Temp: 98.9 F (37.2 C)  SpO2: 96%

## 2021-10-10 NOTE — ED Provider Triage Note (Signed)
Emergency Medicine Provider Triage Evaluation Note  Makenzie Weisner , a 46 y.o. female  was evaluated in triage.  Pt has history of prediabetes, not on medications who presents to the ED from an urgent care center for evaluation of fatigue, blurry vision over the last week.  She was noted to have glucose over 600 at urgent care so she was referred to the ED for evaluation.  She also endorses dry mouth with polyuria.  Patient has been going to weight loss clinic which first detected ketones and glucose in her UA and sent her to urgent care.  She denies fever, chills, chest pain, abdominal pain, nausea, vomiting or diarrhea. Review of Systems  Positive: As above Negative:   Physical Exam  BP (!) 128/100   Pulse (!) 137   Temp 99.5 F (37.5 C) (Oral)   Resp 18   Ht '5\' 2"'$  (1.575 m)   Wt 94.3 kg   LMP 10/10/2021   SpO2 99%   BMI 38.04 kg/m  Gen:   Awake, no distress   Resp:  Normal effort  MSK:   Moves extremities without difficulty  Other:  Abdomen is soft, nondistended nontender  Medical Decision Making  Medically screening exam initiated at 2:03 PM.  Appropriate orders placed.  Hazelynn Mckenny was informed that the remainder of the evaluation will be completed by another provider, this initial triage assessment does not replace that evaluation, and the importance of remaining in the ED until their evaluation is complete.     Tonye Pearson, [place] 10/10/21 1405

## 2021-10-10 NOTE — H&P (Signed)
Date: 10/10/2021               Patient Name:  Sierra King MRN: [id number]  DOB: Jul 08, 1975 Age / Sex: 46 y.o., female   PCP: Servando Salina, MD         Medical Service: Internal Medicine Teaching Service         Attending Physician: Dr. Evette Doffing, Mallie Mussel, *    First Contact: Delene Ruffini, MD Pager: Lysbeth Penner [phone]  Second Contact: Iona Beard, MD Pager: Governor Rooks [phone]       After Hours (After 5p/  First Contact Pager: [phone]  weekends / holidays): Second Contact Pager: [phone]   SUBJECTIVE   Chief Complaint: Blurred vision  History of Present Illness:  Sierra King is a 46 year old person living with prediabetes, OA, hypertension, and history of gestational diabetes who presents for blurred vision and fatigue. Patient reports blurred vision started about 1.5 weeks ago. She went to the optometrist for new glasses 1 week ago. Reports no other abnormalities noted at that visit. She subsequently developed generalized weakness with associated increased thirst and frequent urination.  Also endorses decreased appetite and mild nausea. Has been able to eat and keep food and fluids out.   She notes history of gestational diabetes with prior pregnancies, last was 11 years ago. Has had elevated A1Cs since then in the prediabetic range. Also endorses history of irregular menstrual bleeding for several years and was told she may be in early menopause recently. She has been working on weight loss. Reports have vitamin B shots in May/June and states she has been trying to eat more healthy in the past few months. Also receiving steroid injections R shoulder impingement in July.  She denies fever, chills, cough, shortness of breath, chest pain, palpitation, abdominal pain, and vomiting.    Meds:  Current Meds  Medication Sig   amLODipine (NORVASC) 5 MG tablet Take 5 mg by mouth daily.   carboxymethylcellulose (REFRESH PLUS) 0.5 % SOLN Place 1 drop into both eyes daily.    hydrochlorothiazide (HYDRODIURIL) 25 MG tablet Take 25 mg by mouth daily.   ibuprofen (ADVIL) 200 MG tablet Take 200 mg by mouth every 6 (six) hours as needed for headache or mild pain.    Past Medical History:  Diagnosis Date   Allergy    Anemia    Arthritis    Fibroids    [place] 08/07/2016   Hypertension     Past Surgical History:  Procedure Laterality Date   BREAST LUMPECTOMY Right    CESAREAN SECTION     IR ANGIOGRAM PELVIS SELECTIVE OR SUPRASELECTIVE  08/07/2016   IR ANGIOGRAM PELVIS SELECTIVE OR SUPRASELECTIVE  08/07/2016   IR ANGIOGRAM SELECTIVE EACH ADDITIONAL VESSEL  08/07/2016   IR ANGIOGRAM SELECTIVE EACH ADDITIONAL VESSEL  08/07/2016   IR EMBO TUMOR ORGAN ISCHEMIA INFARCT INC GUIDE ROADMAPPING  08/07/2016   IR RADIOLOGIST EVAL & MGMT  05/16/2016   IR RADIOLOGIST EVAL & MGMT  09/12/2016   IR RADIOLOGIST EVAL & MGMT  02/27/2017   IR US GUIDE VASC ACCESS LEFT  08/07/2016   IR US GUIDE VASC ACCESS RIGHT  08/07/2016   UTERINE ARTERY EMBOLIZATION  08/07/2016   WISDOM TOOTH EXTRACTION      Social:  Lives With: Husband and 2 children (11,13), also has 1 son who is 46 Occupation: Theme park manager Support: Husband able to help at home Level of Function:Independent of ADLs and IADLS PCP: Dr. Lucianne Lei for many years for primary care but recently  started seeing Dr. Dianah Field  for primary care. Substances: Smokes THC, drinks 1-2 drinks of alcohol on the weekends, denies history of tobacco use or other illicit drug use  Family History: DM (Mother, Aunts), Kidney disease (Mother), Prostate cancer (Father)  Allergies: Allergies as of 10/10/2021 - Review Complete 10/10/2021  Allergen Reaction Noted   Latex Itching 05/16/2016    Review of Systems: A complete ROS was negative except as per HPI.   OBJECTIVE:   Physical Exam: Blood pressure (!) 125/94, pulse (!) 118, temperature 99.5 F (37.5 C), temperature source Oral, resp. rate 17, height '5\' 2"'$  (1.575 m), weight 94.3 kg, last menstrual  period 10/10/2021, SpO2 100 %.  Constitutional: tired appearing female sitting in bed, in no acute distress HENT: normocephalic atraumatic, dry MM Eyes: conjunctiva non-erythematous Neck: supple Cardiovascular: tachycardic, regular rhythm, no m/r/g Pulmonary/Chest: normal work of breathing on room air, lungs clear to auscultation bilaterally Abdominal: soft, non-tender, non-distended MSK: normal bulk and tone Neurological: alert & oriented x 3, no focal deficit Skin: warm and dry Psych: mood and affect appropriate  Labs: CBC    Component Value Date/Time   WBC 10.5 10/10/2021 1400   RBC 4.61 10/10/2021 1400   HGB 15.0 10/10/2021 1637   HCT 44.0 10/10/2021 1637   PLT 262 10/10/2021 1400   MCV 93.5 10/10/2021 1400   MCH 30.8 10/10/2021 1400   MCHC 32.9 10/10/2021 1400   RDW 13.0 10/10/2021 1400   LYMPHSABS 3.2 08/07/2016 1238   MONOABS 0.3 08/07/2016 1238   EOSABS 0.3 08/07/2016 1238   BASOSABS 0.0 08/07/2016 1238     CMP     Component Value Date/Time   NA 128 (L) 10/10/2021 1637   K 5.9 (H) 10/10/2021 1637   CL 93 (L) 10/10/2021 1400   CO2 15 (L) 10/10/2021 1400   GLUCOSE 705 (HH) 10/10/2021 1400   BUN 29 (H) 10/10/2021 1400   CREATININE 1.63 (H) 10/10/2021 1400   CALCIUM 10.0 10/10/2021 1400   PROT 8.0 08/07/2016 1238   ALBUMIN 4.2 08/07/2016 1238   AST 18 08/07/2016 1238   ALT 13 (L) 08/07/2016 1238   ALKPHOS 52 08/07/2016 1238   BILITOT 0.5 08/07/2016 1238   GFRNONAA 39 (L) 10/10/2021 1400   GFRAA >60 08/07/2016 1238    Imaging: No results found.  EKG: personally reviewed my interpretation is NSR with LVH   ASSESSMENT & PLAN:    Assessment & Plan by Problem: Principal Problem:   DKA (diabetic ketoacidosis) (HCC)   Sierra King is a 46 year old person living with prediabetes, OA, hypertension, and history of gestational diabetes who presents for blurred vision and fatigue.   # DKA # Dm - Z6X 09.6 # Metabolic acidosis with respiratory  alkalosis BHB >8. Ketones in urine. A1c 12.9.  New diagnosis of DKA. Has hx of gestational diabetes. No trigger identified.  Received 2L IVFs, GAP remains elevated.  - Endotool with DKA protocol - IVF fluids - CBG monitoring - Q4 BMP - NPO  # HTN  Outpatient regimen includes amlodipine and hctz. BP has been low normal, likely due to dehydration.  - hold home BP meds, restart as necessary  # hx OA Patient has been receiving Kenalog injections as outpatient for arthritis. - monitor.  Diet: NPO VTE: Enoxaparin IVF: LR,125cc/hr Code: Full  Prior to Admission Living Arrangement: Home Anticipated Discharge Location: Home Barriers to Discharge: Medical stability  Dispo: Admit patient to Observation with expected length of stay less than 2 midnights.  SignedElliot Gurney,  Rachell Cipro, MD

## 2021-10-10 NOTE — ED Provider Notes (Addendum)
[facility]   [id number] 10/10/21 Arrival Time: 1059  ASSESSMENT & PLAN:  1. Hyperglycemia   2. New onset type 2 diabetes mellitus ([place])    Labs Reviewed  POCT URINALYSIS DIPSTICK, ED / UC - Abnormal; Notable for the following components:      Result Value   Glucose, UA 500 (*)    Ketones, ur >=160 (*)    Hgb urine dipstick SMALL (*)    All other components within normal limits  CBG MONITORING, ED - Abnormal; Notable for the following components:   Glucose-Capillary >600 (*)    All other components within normal limits   To ED via POV. Stable upon discharge. Discussed need for higher level of care. Other than tachycardia, VSS.  Reviewed expectations re: course of current medical issues. Questions answered. Outlined signs and symptoms indicating need for more acute intervention. Patient verbalized understanding. After Visit Summary given.   SUBJECTIVE:  Sierra King is a 46 y.o. female who reports fatigue, blurry vision over past week. At weight loss clinic today; noted to have ketones and glucose in U/A there. Sent here for further evaluation. HgA1c 5.7 10/2018; reports pre-diabetic during pregnancy. Has lost approx 30 lbs over past 8-10 weeks. Ambulatory here. Does report FH of DM. Mild nausea without emesis.  Social History   Substance and Sexual Activity  Alcohol Use Yes   Comment: occasional glass of wine or beer   Social History   Tobacco Use  Smoking Status Never  Smokeless Tobacco Never   Denies recreational drug use.   OBJECTIVE:  Vitals:   10/10/21 1225  BP: 117/83  Pulse: (!) 128  Resp: 18  Temp: 98.9 F (37.2 C)  TempSrc: Oral  SpO2: 96%    Tachycardia noted.  General appearance: alert; no distress; fatigued-appearing; obese Eyes: PERRLA; EOMI; conjunctiva normal HENT: normocephalic; atraumatic Neck: supple with FROM Lungs: clear to auscultation bilaterally Heart: regular rate and rhythm Abdomen: soft, non-tender; bowel  sounds normal Extremities: no cyanosis or edema; symmetrical with no gross deformities Skin: warm and dry Neurologic: normal gait Psychological: alert and cooperative; normal mood and affect  Investigations: Results for orders placed or performed during the hospital encounter of 10/10/21  POC Urinalysis dipstick  Result Value Ref Range   Glucose, UA 500 (A) NEGATIVE mg/dL   Bilirubin Urine NEGATIVE NEGATIVE   Ketones, ur >=160 (A) NEGATIVE mg/dL   Specific Gravity, Urine 1.010 1.005 - 1.030   Hgb urine dipstick SMALL (A) NEGATIVE   pH 5.0 5.0 - 8.0   Protein, ur NEGATIVE NEGATIVE mg/dL   Urobilinogen, UA 0.2 0.0 - 1.0 mg/dL   Nitrite NEGATIVE NEGATIVE   Leukocytes,Ua NEGATIVE NEGATIVE  POC CBG monitoring  Result Value Ref Range   Glucose-Capillary >600 (HH) 70 - 99 mg/dL    Allergies  Allergen Reactions   Latex Itching    Past Medical History:  Diagnosis Date   Allergy    Anemia    Arthritis    Fibroids    [place] 08/07/2016   Hypertension    Social History   Socioeconomic History   Marital status: Married    Spouse name: Not on file   Number of children: Not on file   Years of education: Not on file   Highest education level: Not on file  Occupational History   Not on file  Tobacco Use   Smoking status: Never   Smokeless tobacco: Never  Vaping Use   Vaping Use: Never used  Substance and Sexual Activity  Alcohol use: Yes    Comment: occasional glass of wine or beer   Drug use: Yes    Types: Marijuana    Comment: OCASSIONAL   Sexual activity: Yes    Partners: Male    Birth control/protection: None  Other Topics Concern   Not on file  Social History Narrative   Not on file   Social Determinants of Health   Financial Resource Strain: Not on file  Food Insecurity: Not on file  Transportation Needs: Not on file  Physical Activity: Not on file  Stress: Not on file  Social Connections: Not on file  Intimate Partner Violence: Not on file   Family  History  Problem Relation Age of Onset   Diabetes Mother    Hypertension Mother    Diabetes Father    Hypertension Father    Past Surgical History:  Procedure Laterality Date   BREAST LUMPECTOMY Right    CESAREAN SECTION     IR ANGIOGRAM PELVIS SELECTIVE OR SUPRASELECTIVE  08/07/2016   IR ANGIOGRAM PELVIS SELECTIVE OR SUPRASELECTIVE  08/07/2016   IR ANGIOGRAM SELECTIVE EACH ADDITIONAL VESSEL  08/07/2016   IR ANGIOGRAM SELECTIVE EACH ADDITIONAL VESSEL  08/07/2016   IR EMBO TUMOR ORGAN ISCHEMIA INFARCT INC GUIDE ROADMAPPING  08/07/2016   IR RADIOLOGIST EVAL & MGMT  05/16/2016   IR RADIOLOGIST EVAL & MGMT  09/12/2016   IR RADIOLOGIST EVAL & MGMT  02/27/2017   IR US GUIDE VASC ACCESS LEFT  08/07/2016   IR US GUIDE VASC ACCESS RIGHT  08/07/2016   UTERINE ARTERY EMBOLIZATION  08/07/2016   WISDOM TOOTH EXTRACTION         Vanessa Kick, MD 10/10/21 [place], Dawnya Grams, MD 10/10/21 1354

## 2021-10-11 DIAGNOSIS — Z794 Long term (current) use of insulin: Secondary | ICD-10-CM | POA: Diagnosis not present

## 2021-10-11 DIAGNOSIS — E873 Alkalosis: Secondary | ICD-10-CM | POA: Diagnosis not present

## 2021-10-11 DIAGNOSIS — E1110 Type 2 diabetes mellitus with ketoacidosis without coma: Secondary | ICD-10-CM | POA: Diagnosis not present

## 2021-10-11 LAB — GLUCOSE, CAPILLARY
Glucose-Capillary: 134 mg/dL — ABNORMAL HIGH (ref 70–99)
Glucose-Capillary: 161 mg/dL — ABNORMAL HIGH (ref 70–99)
Glucose-Capillary: 163 mg/dL — ABNORMAL HIGH (ref 70–99)
Glucose-Capillary: 163 mg/dL — ABNORMAL HIGH (ref 70–99)
Glucose-Capillary: 164 mg/dL — ABNORMAL HIGH (ref 70–99)
Glucose-Capillary: 173 mg/dL — ABNORMAL HIGH (ref 70–99)
Glucose-Capillary: 198 mg/dL — ABNORMAL HIGH (ref 70–99)
Glucose-Capillary: 199 mg/dL — ABNORMAL HIGH (ref 70–99)
Glucose-Capillary: 201 mg/dL — ABNORMAL HIGH (ref 70–99)
Glucose-Capillary: 203 mg/dL — ABNORMAL HIGH (ref 70–99)
Glucose-Capillary: 207 mg/dL — ABNORMAL HIGH (ref 70–99)
Glucose-Capillary: 208 mg/dL — ABNORMAL HIGH (ref 70–99)
Glucose-Capillary: 216 mg/dL — ABNORMAL HIGH (ref 70–99)
Glucose-Capillary: 219 mg/dL — ABNORMAL HIGH (ref 70–99)
Glucose-Capillary: 221 mg/dL — ABNORMAL HIGH (ref 70–99)
Glucose-Capillary: 226 mg/dL — ABNORMAL HIGH (ref 70–99)
Glucose-Capillary: 227 mg/dL — ABNORMAL HIGH (ref 70–99)
Glucose-Capillary: 229 mg/dL — ABNORMAL HIGH (ref 70–99)
Glucose-Capillary: 243 mg/dL — ABNORMAL HIGH (ref 70–99)
Glucose-Capillary: 247 mg/dL — ABNORMAL HIGH (ref 70–99)
Glucose-Capillary: 278 mg/dL — ABNORMAL HIGH (ref 70–99)

## 2021-10-11 LAB — BASIC METABOLIC PANEL
Anion gap: 17 — ABNORMAL HIGH (ref 5–15)
Anion gap: 10 (ref 5–15)
Anion gap: 14 (ref 5–15)
BUN: 20 mg/dL (ref 6–20)
BUN: 15 mg/dL (ref 6–20)
BUN: 13 mg/dL (ref 6–20)
CO2: 21 mmol/L — ABNORMAL LOW (ref 22–32)
CO2: 25 mmol/L (ref 22–32)
CO2: 25 mmol/L (ref 22–32)
Calcium: 9.8 mg/dL (ref 8.9–10.3)
Calcium: 9.4 mg/dL (ref 8.9–10.3)
Calcium: 9.7 mg/dL (ref 8.9–10.3)
Chloride: 101 mmol/L (ref 98–111)
Chloride: 105 mmol/L (ref 98–111)
Chloride: 101 mmol/L (ref 98–111)
Creatinine, Ser: 1.03 mg/dL — ABNORMAL HIGH (ref 0.44–1.00)
Creatinine, Ser: 0.9 mg/dL (ref 0.44–1.00)
Creatinine, Ser: 0.84 mg/dL (ref 0.44–1.00)
GFR, Estimated: >60 mL/min (ref >60)
GFR, Estimated: >60 mL/min (ref >60)
GFR, Estimated: >60 mL/min (ref >60)
Glucose, Bld: 243 mg/dL — ABNORMAL HIGH (ref 70–99)
Glucose, Bld: 228 mg/dL — ABNORMAL HIGH (ref 70–99)
Glucose, Bld: 167 mg/dL — ABNORMAL HIGH (ref 70–99)
Potassium: 3.7 mmol/L (ref 3.5–5.1)
Potassium: 3.4 mmol/L — ABNORMAL LOW (ref 3.5–5.1)
Potassium: 3.5 mmol/L (ref 3.5–5.1)
Sodium: 139 mmol/L (ref 135–145)
Sodium: 140 mmol/L (ref 135–145)
Sodium: 140 mmol/L (ref 135–145)

## 2021-10-11 LAB — BETA-HYDROXYBUTYRIC ACID
Beta-Hydroxybutyric Acid: 3.08 mmol/L — ABNORMAL HIGH (ref 0.05–0.27)
Beta-Hydroxybutyric Acid: 0.91 mmol/L — ABNORMAL HIGH (ref 0.05–0.27)
Beta-Hydroxybutyric Acid: 0.73 mmol/L — ABNORMAL HIGH (ref 0.05–0.27)

## 2021-10-11 LAB — HIV ANTIBODY (ROUTINE TESTING W REFLEX): HIV Screen 4th Generation wRfx: NONREACTIVE

## 2021-10-11 MED ORDER — LIVING WELL WITH DIABETES BOOK
Freq: Once | Status: AC
  Filled 2021-10-11: qty 1

## 2021-10-11 MED ORDER — INSULIN ASPART 100 UNIT/ML IJ SOLN
0.0000 [IU] | Freq: Every day | INTRAMUSCULAR | Status: DC
  Administered 2021-10-11: 2 [IU] via SUBCUTANEOUS

## 2021-10-11 MED ORDER — INSULIN GLARGINE-YFGN 100 UNIT/ML ~~LOC~~ SOLN
30.0000 [IU] | Freq: Every day | SUBCUTANEOUS | Status: DC
  Administered 2021-10-11 – 2021-10-12 (×2): 30 [IU] via SUBCUTANEOUS
  Filled 2021-10-11 (×2): qty 0.3

## 2021-10-11 MED ORDER — INSULIN ASPART 100 UNIT/ML IJ SOLN: 4.0000 [IU] | Freq: Three times a day (TID) | INTRAMUSCULAR | Status: DC

## 2021-10-11 MED ORDER — INSULIN ASPART 100 UNIT/ML IJ SOLN
0.0000 [IU] | Freq: Three times a day (TID) | INTRAMUSCULAR | Status: DC
  Administered 2021-10-11 – 2021-10-12 (×3): 11 [IU] via SUBCUTANEOUS
  Administered 2021-10-12: 4 [IU] via SUBCUTANEOUS

## 2021-10-11 MED ORDER — DOCUSATE SODIUM 100 MG PO CAPS
100.0000 mg | ORAL_CAPSULE | Freq: Once | ORAL | Status: AC
  Administered 2021-10-11: 100 mg via ORAL
  Filled 2021-10-11: qty 1

## 2021-10-11 MED ORDER — AMLODIPINE BESYLATE 5 MG PO TABS
5.0000 mg | ORAL_TABLET | Freq: Every day | ORAL | Status: DC
  Administered 2021-10-11 – 2021-10-12 (×2): 5 mg via ORAL
  Filled 2021-10-11 (×2): qty 1

## 2021-10-11 MED ORDER — INSULIN STARTER KIT- PEN NEEDLES (ENGLISH)
1.0000 | Freq: Once | Status: AC
  Administered 2021-10-11: 1
  Filled 2021-10-11: qty 1

## 2021-10-11 NOTE — Inpatient Diabetes Management (Signed)
Inpatient Diabetes Program Recommendations  AACE/ADA: New Consensus Statement on Inpatient Glycemic Control (2015)  Target Ranges:  Prepandial:   less than 140 mg/dL      Peak postprandial:   less than 180 mg/dL (1-2 hours)      Critically ill patients:  140 - 180 mg/dL   Lab Results  Component Value Date   GLUCAP 226 (H) 10/11/2021   HGBA1C 12.9 (H) 10/10/2021    Review of Glycemic Control  Diabetes history: GDM, now new DM2 Outpatient Diabetes medications: None Current orders for Inpatient glycemic control: IV insulin per [facility] for DKA  HgbA1C - 12.9%  Inpatient Diabetes Program Recommendations:    Semglee 15 units BID (kg x 0.3)  Novolog 0-15 units TID with meals and 0-5 HS  Novolog 4 units TID with meals if eating > 50%.  Spoke with pt about new diagnosis Discussed A1C results (12.9%) and explained what an A1C is and informed patient that his current A1C indicates an average glucose of 324 mg/dl over the past 2-3 months. Discussed basic pathophysiology of DM Type 2, basic home care, importance of checking CBGs and maintaining good CBG control to prevent long-term and short-term complications. Reviewed glucose and A1C goals and explained that patient will need to continue to monitor blood sugars at least 3-4x/day. Reviewed signs and symptoms of hyperglycemia and hypoglycemia along with treatment for both. Discussed impact of nutrition, exercise, stress, sickness, and medications on diabetes control. Ordered Living Well with diabetes booklet and encouraged patient to read through entire book.   Demonstrated insulin pen use. Will allow pt to demonstrated insulin pen administration in am.  Will need insulin pens, insulin pen needles and blood glucose meter at discharge. See again in am.   Continue to follow.  Thank you. Lorenda Peck, RD, LDN, [place] Inpatient Diabetes Coordinator [phone]

## 2021-10-11 NOTE — Progress Notes (Addendum)
HD#0 SUBJECTIVE:  Patient Summary: Tawania Daponte is a 46 year old person living with prediabetes, OA, hypertension, and history of gestational diabetes who presents for blurred vision and fatigue, admitted for DKA.  Overnight Events: NAEO    Interm History:  States she feels better this morning. Fatigue has improved some but did not sleep well. Blurry vision is improving. Discussed with her new dx of DM and that she will need to manage this daily. Likely stay additional day to ensure all education and supplies are provided.   OBJECTIVE:  Vital Signs: Vitals:   10/10/21 2202 10/11/21 0013 10/11/21 0410 10/11/21 1030  BP: 107/83 (!) 141/75 (!) 148/80 110/72  Pulse: (!) 114 (!) 108 94   Resp: '18 20 20   '$ Temp: 98.6 F (37 C) 97.8 F (36.6 C) 97.7 F (36.5 C)   TempSrc: Oral Oral Oral   SpO2: 97% 98% 99%   Weight:      Height:       Supplemental O2:  SpO2: 99 %  Filed Weights   10/10/21 1352  Weight: 94.3 kg     Intake/Output Summary (Last 24 hours) at 10/11/2021 1347 Last data filed at 10/11/2021 6203 Gross per 24 hour  Intake 3549.33 ml  Output --  Net 3549.33 ml   Net IO Since Admission: 3,549.33 mL [10/11/21 1347]  Physical Exam: Constitutional: resting comfortably in bed, in no acute distress Cardiovascular: regular rate, regular rhythm, no m/r/g Pulmonary/Chest: normal work of breathing on room air, lungs clear to auscultation bilaterally Neurological: alert & oriented x 3, no focal deficit Skin: warm and dry Psych: mood and affect appropriate  Patient Lines/Drains/Airways Status     Active Line/Drains/Airways     Name Placement date Placement time Site Days   Peripheral IV 10/10/21 20 G 2.5" Right;Anterior Forearm 10/10/21  2116  Forearm  1   Incision (Closed) 08/07/16 Perineum 08/07/16  1813  -- 1891            Pertinent Labs:    Latest Ref Rng & Units 10/10/2021   11:22 PM 10/10/2021    4:37 PM 10/10/2021    2:00 PM  CBC  WBC 4.0 - 10.5  K/uL 9.8   10.5   Hemoglobin 12.0 - 15.0 g/dL 12.7  15.0  14.2   Hematocrit 36.0 - 46.0 % 36.0  44.0  43.1   Platelets 150 - 400 K/uL 253   262        Latest Ref Rng & Units 10/11/2021    7:08 AM 10/10/2021   11:22 PM 10/10/2021    9:15 PM  CMP  Glucose 70 - 99 mg/dL 228  243  203   BUN 6 - 20 mg/dL '15  20  23   '$ Creatinine 0.44 - 1.00 mg/dL 0.90  1.03  1.14   Sodium 135 - 145 mmol/L 140  139  139   Potassium 3.5 - 5.1 mmol/L 3.4  3.7  3.8   Chloride 98 - 111 mmol/L 105  101  103   CO2 22 - 32 mmol/L '25  21  23   '$ Calcium 8.9 - 10.3 mg/dL 9.4  9.8  10.0     Recent Labs    10/11/21 1128 10/11/21 1233 10/11/21 1331  GLUCAP 226* 203* 134*     Pertinent Imaging: No results found.  ASSESSMENT/PLAN:  Assessment: Principal Problem:   DKA (diabetic ketoacidosis) ([place]) Active Problems:   Essential hypertension    Tameshia Conchas is a 46 year  old person living with prediabetes, OA, hypertension, and history of gestational diabetes who presents for blurred vision and fatigue, admitted for DKA.  # DKA # Dm - Z6W 10.9 # Metabolic acidosis with respiratory alkalosis New diagnosis of DKA. Ketosis presenting diabetes. Patient requiring substantial amount of insulin per hour. Likely has significant underlying insulin resistance. May also have degree of insulin insufficiency as well. Close follow up of this new diagnosis will be necessary for patient success.  She has been volume resuscitated and appears clinically improved this morning. GAP closed X1, repeat BMP pending.  - Endotool with DKA protocol until GAP closed X2. Will start on long acting insulin and provide diet if GAP closed on repeat BMP. Hopefully she will be able to have lunch today.  - IVF fluids until taking PO - CBG monitoring - Q4 BMP - schedule in [facility] for close follow up of her DM once closer to discharge.   - consult to diabetes coordinator.    # HTN  Outpatient regimen includes amlodipine and hctz. BP has been  low normal, likely due to dehydration.  - restarted amlodipine.    # hx OA Patient has been receiving Kenalog injections as outpatient for arthritis. - monitor.   Diet: NPO VTE: Enoxaparin Code: Full Prior to Admission Living Arrangement: Home Anticipated Discharge Location: Home Barriers to Discharge: Medical stability Dispo: Admit patient to Observation with expected length of stay less than 2 midnights.   Signed: Delene Ruffini, MD

## 2021-10-11 NOTE — TOC Initial Note (Signed)
Transition of Care [facility]) - Initial/Assessment Note    Patient Details  Name: Sierra King MRN: [id number] Date of Birth: Jan 03, 1976  [facility] CM/SW Contact:    Ninfa Meeker, RN Phone Number: 10/11/2021, 11:58 AM  Clinical Narrative:                 Transition of Care Screening Note: Transition of Care Department [facility]) has reviewed patient and no TOC needs have been identified at this time. We will continue to monitor patient advancement through Interdisciplinary progressions. If new patient transition needs arise, please place a consult.          Patient Goals and CMS Choice        Expected Discharge Plan and Services                                                Prior Living Arrangements/Services                       Activities of Daily Living Home Assistive Devices/Equipment: None ADL Screening (condition at time of admission) Patient's cognitive ability adequate to safely complete daily activities?: Yes Is the patient deaf or have difficulty hearing?: No Does the patient have difficulty seeing, even when wearing glasses/contacts?: No Does the patient have difficulty concentrating, remembering, or making decisions?: No Patient able to express need for assistance with ADLs?: Yes Does the patient have difficulty dressing or bathing?: No Independently performs ADLs?: Yes (appropriate for developmental age) Does the patient have difficulty walking or climbing stairs?: No Weakness of Legs: None Weakness of Arms/Hands: None  Permission Sought/Granted                  Emotional Assessment              Admission diagnosis:  DKA (diabetic ketoacidosis) ([place]) [E11.10] AKI (acute kidney injury) ([place]) [N17.9] Diabetic ketoacidosis without coma associated with other specified diabetes mellitus ([place]) [E13.10] Patient Active Problem List   Diagnosis Date Noted   DKA (diabetic ketoacidosis) ([place]) 10/10/2021   Primary  osteoarthritis of both knees 06/20/2021   Impingement syndrome, shoulder, right 06/20/2021   Essential hypertension 11/27/2018   Fibroids 08/07/2016   PCP:  Servando Salina, MD Pharmacy:   [id number] [address] [place] [address]Phone: [phone]:: [phone]    Social Determinants of Health (SDOH) Interventions    Readmission Risk Interventions     No data to display

## 2021-10-11 NOTE — Plan of Care (Signed)

## 2021-10-12 ENCOUNTER — Other Ambulatory Visit (HOSPITAL_COMMUNITY): Payer: Self-pay

## 2021-10-12 ENCOUNTER — Ambulatory Visit: Payer: 59 | Admitting: Sports Medicine

## 2021-10-12 DIAGNOSIS — Z794 Long term (current) use of insulin: Secondary | ICD-10-CM | POA: Diagnosis not present

## 2021-10-12 DIAGNOSIS — E873 Alkalosis: Secondary | ICD-10-CM | POA: Diagnosis not present

## 2021-10-12 DIAGNOSIS — E1110 Type 2 diabetes mellitus with ketoacidosis without coma: Secondary | ICD-10-CM | POA: Diagnosis not present

## 2021-10-12 LAB — GLUCOSE, CAPILLARY
Glucose-Capillary: 298 mg/dL — ABNORMAL HIGH (ref 70–99)
Glucose-Capillary: 284 mg/dL — ABNORMAL HIGH (ref 70–99)
Glucose-Capillary: 200 mg/dL — ABNORMAL HIGH (ref 70–99)
Glucose-Capillary: 169 mg/dL — ABNORMAL HIGH (ref 70–99)

## 2021-10-12 LAB — BASIC METABOLIC PANEL
Anion gap: 10 (ref 5–15)
BUN: 11 mg/dL (ref 6–20)
CO2: 24 mmol/L (ref 22–32)
Calcium: 8.9 mg/dL (ref 8.9–10.3)
Chloride: 101 mmol/L (ref 98–111)
Creatinine, Ser: 0.85 mg/dL (ref 0.44–1.00)
GFR, Estimated: >60 mL/min (ref >60)
Glucose, Bld: 262 mg/dL — ABNORMAL HIGH (ref 70–99)
Potassium: 3.3 mmol/L — ABNORMAL LOW (ref 3.5–5.1)
Sodium: 135 mmol/L (ref 135–145)

## 2021-10-12 MED ORDER — NOVOLOG FLEXPEN 100 UNIT/ML ~~LOC~~ SOPN
8.0000 [IU] | PEN_INJECTOR | Freq: Three times a day (TID) | SUBCUTANEOUS | 0 refills | Status: DC
  Filled 2021-10-12: qty 3, 30d supply, fill #0

## 2021-10-12 MED ORDER — INSULIN PEN NEEDLE 32G X 4 MM MISC
1.0000 | Freq: Four times a day (QID) | 0 refills | Status: DC | PRN
  Filled 2021-10-12: qty 200, 30d supply, fill #0

## 2021-10-12 MED ORDER — BLOOD GLUCOSE MONITOR KIT
PACK | 0 refills | Status: DC
  Filled 2021-10-12: qty 1, 30d supply, fill #0

## 2021-10-12 MED ORDER — POTASSIUM CHLORIDE 20 MEQ PO PACK
40.0000 meq | PACK | Freq: Once | ORAL | Status: AC
  Administered 2021-10-12: 40 meq via ORAL
  Filled 2021-10-12: qty 2

## 2021-10-12 MED ORDER — INSULIN GLARGINE 100 UNITS/ML SOLOSTAR PEN
30.0000 [IU] | PEN_INJECTOR | Freq: Every day | SUBCUTANEOUS | 0 refills | Status: DC
  Filled 2021-10-12: qty 9, 30d supply, fill #0

## 2021-10-12 MED ORDER — INSULIN ASPART 100 UNIT/ML IJ SOLN
8.0000 [IU] | Freq: Three times a day (TID) | INTRAMUSCULAR | Status: DC
  Administered 2021-10-12 (×2): 8 [IU] via SUBCUTANEOUS

## 2021-10-12 MED ORDER — METFORMIN HCL 500 MG PO TABS
500.0000 mg | ORAL_TABLET | Freq: Every day | ORAL | 0 refills | Status: DC
  Filled 2021-10-12: qty 30, 30d supply, fill #0

## 2021-10-12 NOTE — Inpatient Diabetes Management (Signed)
Inpatient Diabetes Program Recommendations  AACE/ADA: New Consensus Statement on Inpatient Glycemic Control (2015)  Target Ranges:  Prepandial:   less than 140 mg/dL      Peak postprandial:   less than 180 mg/dL (1-2 hours)      Critically ill patients:  140 - 180 mg/dL   Lab Results  Component Value Date   GLUCAP 298 (H) 10/12/2021   HGBA1C 12.9 (H) 10/10/2021    Review of Glycemic Control  262, 298 mg/dL  Current orders for Inpatient glycemic control: Semglee 30 units QD, Novolog 0-20 TID with meals and 0-5 HS + 8 units TID  Educated patient on insulin pen use at home. Reviewed contents of insulin flexpen starter kit. Reviewed all steps if insulin pen including attachment of needle, 2-unit air shot, dialing up dose, giving injection, removing needle, disposal of sharps, storage of unused insulin, disposal of insulin etc. Patient able to provide successful return demonstration. Also reviewed troubleshooting with insulin pen. MD to give patient Rxs for insulin pens and insulin pen needles.  Inpatient Diabetes Program Recommendations:    Increase Semglee to 35 units QD  Will need for discharge:  Lantus/Levemir pen Novolog/Humalog pen Insulin pen needles - #[id number] Blood glucose meter kit - #[id number]  Pt will f/u with [facility] for PCP.  Appreciative of visit and pt appears to be motivated to contol her blood sugars.   Thank you. Rhonda Vaughan, RD, LDN, [facility] Inpatient Diabetes Coordinator [phone]           

## 2021-10-12 NOTE — Discharge Summary (Signed)
Name: Sierra King MRN: [id number] DOB: May 05, 1975 46 y.o. PCP: Sierra [place]ina, MD  Date of Admission: 10/10/2021  1:28 PM Date of Discharge: 10/12/21 Attending Physician: Dr. Evette Doffing  Discharge Diagnosis: Principal Problem:   DKA (diabetic ketoacidosis) [facility]) Active Problems:   Essential hypertension    Discharge Medications: Allergies as of 10/12/2021       Reactions   Latex Itching        Medication List     STOP taking these medications    carboxymethylcellulose 0.5 % Soln Commonly known as: REFRESH PLUS       TAKE these medications    Accu-Chek Guide w/Device Kit Use up to four times daily as directed. Notes to patient: Use with Blood sugar checks   amLODipine 5 MG tablet Commonly known as: NORVASC Take 5 mg by mouth daily. Notes to patient: Start on 10/13/21 in the morning   Basaglar KwikPen 100 UNIT/ML Inject 30 Units into the skin daily. Notes to patient: Start on 10/13/21 in the morning   hydrochlorothiazide 25 MG tablet Commonly known as: HYDRODIURIL Take 25 mg by mouth daily. Notes to patient: Start on 10/13/21 in the morning   ibuprofen 200 MG tablet Commonly known as: ADVIL Take 200 mg by mouth every 6 (six) hours as needed for headache or mild pain.   metFORMIN 500 MG tablet Commonly known as: Glucophage Take 1 tablet (500 mg total) by mouth daily with breakfast. Notes to patient: Start on 10/13/21 in the morning   NovoLOG FlexPen 100 UNIT/ML FlexPen Generic drug: insulin aspart Inject 8 Units into the skin with breakfast, with lunch, and with evening meal. Notes to patient: Start in the morning on 10/13/21. This evening's dose will be given prior to discharge by primary RN Carly.    Pentips 32G X 4 MM Misc Generic drug: Insulin Pen Needle Use 4 (four) times daily as needed. Notes to patient: Use with Insulin administration.         Disposition and follow-up:   [name] was discharged from [facility] in Stable condition.  At the hospital follow up visit please address:  1.  Follow-up:  a. Diabetes - presented as DKA. She has high insulin resistance. Follow up home glucose measurements. Ensure she is administering insulin appropriately with appropriate insulin changes. DM education if necessary. Up-titrate metformin.     b. HTN - controlled with amlodipine. Consider stopping hydral if necessary.  2.  Labs / imaging needed at time of follow-up: bmp, cbc, cbg  3.  Pending labs/ test needing follow-up: none  4.  Medication Changes  Started: Insulin glargine 30 units, insulin aspart 8 units TID with meal, metformin 500.   Follow-up Appointments: [facility] Sept 26th   Hospital Course by problem list:  Patient presented with complaints of blurry vision, fatigue, and nausea. CBG noted to be greater than 700 with elevated BHB and ketonuria. Started on endotool with improvement in glucose and gap closed X2. Started on subq insulin. Provided with DM education in hospital and new medications. Discharged home  instable condition with instructions for follow up in [facility].     Discharge Subjective: Feeling overwhelmed by new dx requirements.  Discharge Exam:   BP 113/72 (BP Location: Left Arm)   Pulse (!) 116   Temp 98.4 F (36.9 C) (Oral)   Resp 20   Ht _0  (1.575 m)   Wt 94.3 kg   LMP 10/10/2021   SpO2 100%   BMI 38.04 kg/m  Constitutional: resting comfortably in bed, in no acute distress Pulmonary/Chest: normal work of breathing on room air Neurological: alert & oriented, no focal deficit Skin: no rashes Psych: mood and affect appropriate  Pertinent Labs, Studies, and Procedures:     Latest Ref Rng & Units 10/10/2021   11:22 PM 10/10/2021    4:37 PM 10/10/2021    2:00 PM  CBC  WBC 4.0 - 10.5 K/uL 9.8   10.5   Hemoglobin 12.0 - 15.0 g/dL 12.7  15.0  14.2   Hematocrit 36.0 - 46.0 % 36.0  44.0  43.1   Platelets 150 - 400 K/uL 253   262        Latest Ref Rng & Units  10/12/2021    3:26 AM 10/11/2021    1:02 PM 10/11/2021    7:08 AM  CMP  Glucose 70 - 99 mg/dL 262  167  228   BUN 6 - 20 mg/dL _0 Creatinine 0.44 - 1.00 mg/dL 0.85  0.84  0.90   Sodium 135 - 145 mmol/L 135  140  140   Potassium 3.5 - 5.1 mmol/L 3.3  3.5  3.4   Chloride 98 - 111 mmol/L 101  101  105   CO2 22 - 32 mmol/L _1 Calcium 8.9 - 10.3 mg/dL 8.9  9.7  9.4      Discharge Instructions: Discharge Instructions     Call MD for:  difficulty breathing, headache or visual disturbances   Complete by: As directed    Call MD for:  extreme fatigue   Complete by: As directed    Call MD for:  hives   Complete by: As directed    Call MD for:  persistant dizziness or light-headedness   Complete by: As directed    Call MD for:  persistant nausea and vomiting   Complete by: As directed    Call MD for:  redness, tenderness, or signs of infection (pain, swelling, redness, odor or green/yellow discharge around incision site)   Complete by: As directed    Call MD for:  severe uncontrolled pain   Complete by: As directed    Call MD for:  temperature >100.4   Complete by: As directed    Diet Carb Modified   Complete by: As directed    Increase activity slowly   Complete by: As directed        Signed: Delene Ruffini, MD 10/12/2021, 6:16 PM   Pager: [phone]

## 2021-10-12 NOTE — Plan of Care (Signed)

## 2021-10-12 NOTE — Discharge Instructions (Addendum)
Dear Mrs. Sierra King,  Thank you for trusting [place] with your care. We treated you in the hospital for diabetic ketoacidosis.   We are providing you with two forms of insulin. One is a long acting (insulin glargine). The other is a short acting (insulin aspart). Please inject 30 units of the long acting insulin once daily at night. Please inject 8 units of short acting insulin just before you eat (3 times per day). It will also be important to check your blood sugar 2-3 times per day. Please check it once before you eat breakfast, once before either lunch or dinner, and once prior to bed. We also are providing you with a medication called metformin. Please take one '500mg'$  tablet in the morning with breakfast. This medication can cause some GI upset at first, but this should get better in 1-2 weeks.  We have scheduled a hospital follow up with your clinic on September the 26th at 2:45pm with Dr. Marlou Sa. This appointment will be important to go to to help determine if any changes need to be made to your diabetes regimen.

## 2021-10-12 NOTE — Progress Notes (Signed)
Mobility Specialist Progress Note:   10/12/21 1100  Mobility  Activity Ambulated independently in hallway  Level of Assistance Modified independent, requires aide device or extra time  Assistive Device None  Distance Ambulated (ft) 400 ft  Activity Response Tolerated well  $Mobility charge 1 Mobility    During Mobility: HR 140bpm Post Mobility: HR 118bpm  Pt eager for mobility session. Ambulated at a modI level. HR elevated to 140bpm with ambulation. Pt left sitting in chair with all needs met.   Nelta Numbers Acute Rehab Secure Chat or Office Phone: [phone]

## 2021-10-12 NOTE — Progress Notes (Signed)
Discharge instructions (including medications) discussed with and copy provided to patient/caregiver. Reviewed by Primary RN Carly.

## 2021-10-15 ENCOUNTER — Ambulatory Visit
Admission: EM | Admit: 2021-10-15 | Discharge: 2021-10-15 | Disposition: A | Discharge status: Home / Self Care | Payer: 59 | Attending: Physician Assistant | Admitting: Physician Assistant

## 2021-10-15 ENCOUNTER — Encounter: Payer: Self-pay | Admitting: Emergency Medicine

## 2021-10-15 DIAGNOSIS — B351 Tinea unguium: Secondary | ICD-10-CM | POA: Diagnosis not present

## 2021-10-15 DIAGNOSIS — H60501 Unspecified acute noninfective otitis externa, right ear: Secondary | ICD-10-CM

## 2021-10-15 MED ORDER — CIPROFLOXACIN-DEXAMETHASONE 0.3-0.1 % OT SUSP: 4.0000 [drp] | Freq: Two times a day (BID) | OTIC | 0 refills | Status: AC

## 2021-10-15 NOTE — ED Provider Notes (Signed)
[facility]    CSN: [id number] Arrival date & time: 10/15/21  1301      History   Chief Complaint Chief Complaint  Patient presents with   Ear Injury    Blood sugarToe Fungus - Entered by patient    HPI Sierra King is a 46 y.o. female.   Here today for evaluation of right ear pain after she accidentally scratched the inside of her ear about a week ago.  She reports she has continued to have some pain and notes she did have bleeding initially.  She has not had fever.  She denies any issues with her left ear.  She also reports toenail fungus.  She notes that this started after she got her toenails done at salon.  The history is provided by the patient.    Past Medical History:  Diagnosis Date   Allergy    Anemia    Arthritis    Fibroids    [place] 08/07/2016   Hypertension     Patient Active Problem List   Diagnosis Date Noted   DKA (diabetic ketoacidosis) ([place]) 10/10/2021   Primary osteoarthritis of both knees 06/20/2021   Impingement syndrome, shoulder, right 06/20/2021   Essential hypertension 11/27/2018   Fibroids 08/07/2016    Past Surgical History:  Procedure Laterality Date   BREAST LUMPECTOMY Right    CESAREAN SECTION     IR ANGIOGRAM PELVIS SELECTIVE OR SUPRASELECTIVE  08/07/2016   IR ANGIOGRAM PELVIS SELECTIVE OR SUPRASELECTIVE  08/07/2016   IR ANGIOGRAM SELECTIVE EACH ADDITIONAL VESSEL  08/07/2016   IR ANGIOGRAM SELECTIVE EACH ADDITIONAL VESSEL  08/07/2016   IR EMBO TUMOR ORGAN ISCHEMIA INFARCT INC GUIDE ROADMAPPING  08/07/2016   IR RADIOLOGIST EVAL & MGMT  05/16/2016   IR RADIOLOGIST EVAL & MGMT  09/12/2016   IR RADIOLOGIST EVAL & MGMT  02/27/2017   IR US GUIDE VASC ACCESS LEFT  08/07/2016   IR US GUIDE VASC ACCESS RIGHT  08/07/2016   UTERINE ARTERY EMBOLIZATION  08/07/2016   WISDOM TOOTH EXTRACTION      OB History   No obstetric history on file.      Home Medications    Prior to Admission medications   Medication Sig Start Date End Date  Taking? Authorizing Provider  ciprofloxacin-dexamethasone (CIPRODEX) OTIC suspension Place 4 drops into the right ear 2 (two) times daily for 7 days. 10/15/21 10/22/21 Yes Francene Finders, PA-C  amLODipine (NORVASC) 5 MG tablet Take 5 mg by mouth daily. 10/09/21   [provider]  blood glucose meter kit and supplies KIT Use up to four times daily as directed. 10/12/21   Delene Ruffini, MD  hydrochlorothiazide (HYDRODIURIL) 25 MG tablet Take 25 mg by mouth daily. 10/09/21   [provider]  ibuprofen (ADVIL) 200 MG tablet Take 200 mg by mouth every 6 (six) hours as needed for headache or mild pain.    [provider]  insulin aspart (NOVOLOG FLEXPEN) 100 UNIT/ML FlexPen Inject 8 Units into the skin with breakfast, with lunch, and with evening meal. 10/12/21 11/11/21  Delene Ruffini, MD  insulin glargine (LANTUS) 100 unit/mL SOPN Inject 30 Units into the skin daily. 10/12/21 11/11/21  Delene Ruffini, MD  Insulin Pen Needle 32G X 4 MM MISC Use 4 (four) times daily as needed. 10/12/21   Delene Ruffini, MD  metFORMIN (GLUCOPHAGE) 500 MG tablet Take 1 tablet (500 mg total) by mouth daily with breakfast. 10/12/21 11/11/21  Delene Ruffini, MD    Family History Family  History  Problem Relation Age of Onset   Diabetes Mother    Hypertension Mother    Diabetes Father    Hypertension Father     Social History Social History   Tobacco Use   Smoking status: Never   Smokeless tobacco: Never  Vaping Use   Vaping Use: Never used  Substance Use Topics   Alcohol use: Yes    Comment: occasional glass of wine or beer   Drug use: Yes    Types: Marijuana    Comment: OCASSIONAL     Allergies   Latex   Review of Systems Review of Systems  Constitutional:  Negative for chills and fever.  HENT:  Positive for ear pain. Negative for congestion and ear discharge.   Eyes:  Negative for discharge and redness.  Respiratory:  Negative for cough and shortness of breath.    Gastrointestinal:  Negative for nausea and vomiting.     Physical Exam Triage Vital Signs ED Triage Vitals  Enc Vitals Group     BP 10/15/21 1321 101/73     Pulse Rate 10/15/21 1321 95     Resp 10/15/21 1321 16     Temp 10/15/21 1321 98.7 F (37.1 C)     Temp Source 10/15/21 1321 Oral     SpO2 10/15/21 1321 97 %     Weight --      Height --      Head Circumference --      Peak Flow --      Pain Score 10/15/21 1320 2     Pain Loc --      Pain Edu? --      Excl. in [place]? --    No data found.  Updated Vital Signs BP 101/73 (BP Location: Left Arm)   Pulse 95   Temp 98.7 F (37.1 C) (Oral)   Resp 16   LMP 10/10/2021   SpO2 97%    Physical Exam Vitals and nursing note reviewed.  Constitutional:      General: She is not in acute distress.    Appearance: Normal appearance. She is not ill-appearing.  HENT:     Head: Normocephalic and atraumatic.     Right Ear: Tympanic membrane normal.     Ears:     Comments: Right EAC erythematous Eyes:     Conjunctiva/sclera: Conjunctivae normal.  Cardiovascular:     Rate and Rhythm: Normal rate.  Pulmonary:     Effort: Pulmonary effort is normal.  Skin:    Comments: Toenails discolored diffusely  Neurological:     Mental Status: She is alert.  Psychiatric:        Mood and Affect: Mood normal.        Behavior: Behavior normal.        Thought Content: Thought content normal.      UC Treatments / Results  Labs (all labs ordered are listed, but only abnormal results are displayed) Labs Reviewed - No data to display  EKG   Radiology No results found.  Procedures Procedures (including critical care time)  Medications Ordered in UC Medications - No data to display  Initial Impression / Assessment and Plan / UC Course  I have reviewed the triage vital signs and the nursing notes.  Pertinent labs & imaging results that were available during my care of the patient were reviewed by me and considered in my medical  decision making (see chart for details).    Ciprodex drops prescribed to cover possible otitis  externa.  Recommended follow-up if no gradual improvement or with any further concerns.  Discussed treatment for onychomycosis and recommended further evaluation and treatment through her PCP as oral medications are typically more effective.  Patient is agreeable to same.  Final Clinical Impressions(s) / UC Diagnoses   Final diagnoses:  Acute otitis externa of right ear, unspecified type  Onychomycosis   Discharge Instructions   None    ED Prescriptions     Medication Sig Dispense Auth. Provider   ciprofloxacin-dexamethasone (CIPRODEX) OTIC suspension Place 4 drops into the right ear 2 (two) times daily for 7 days. 7.5 mL Francene Finders, PA-C      PDMP not reviewed this encounter.   Francene Finders, PA-C 10/15/21 1407

## 2021-10-15 NOTE — ED Triage Notes (Signed)
Pt said about 1 week ago she used her nail to scratch her right ear and her nail went to far and hurt the inside of her ear. Pt said did bleed and hurt. Pt said also a toe fungus on the right big toe.

## 2021-10-17 ENCOUNTER — Telehealth: Payer: Self-pay | Admitting: *Deleted

## 2021-10-17 NOTE — Telephone Encounter (Signed)
Call from patient questions about when she should take her Insulin, eat and exercise.  Will forward to Diabetes Educator- D. Plyler to see if she will contact patient.    Can bee reached at [phone].

## 2021-10-17 NOTE — Telephone Encounter (Signed)
Left a message about when to take her two different insulins and a call back number for other questions.

## 2021-10-21 ENCOUNTER — Telehealth: Payer: Self-pay

## 2021-10-21 NOTE — Telephone Encounter (Signed)
Return pt's call - stated she only have 50 units of Novolog left, requesting a refill. Also stated she's not feeling well,BS's may be low. She's a HFU; she has an appt Tuesday 9/26. Informed pt to go to UC and to keep the Tues appt; no telehealth appts available for this afternoon.

## 2021-10-21 NOTE — Telephone Encounter (Signed)
insulin aspart (NOVOLOG FLEXPEN) 100 UNIT/ML FlexPen, refill request @ [id number]- [place], [place] - [id number]

## 2021-10-24 ENCOUNTER — Telehealth: Payer: Self-pay | Admitting: *Deleted

## 2021-10-24 ENCOUNTER — Ambulatory Visit
Admission: EM | Admit: 2021-10-24 | Discharge: 2021-10-24 | Disposition: A | Discharge status: Home / Self Care | Payer: 59 | Attending: Physician Assistant | Admitting: Physician Assistant

## 2021-10-24 DIAGNOSIS — E1165 Type 2 diabetes mellitus with hyperglycemia: Secondary | ICD-10-CM | POA: Diagnosis not present

## 2021-10-24 DIAGNOSIS — E119 Type 2 diabetes mellitus without complications: Secondary | ICD-10-CM

## 2021-10-24 DIAGNOSIS — Z794 Long term (current) use of insulin: Secondary | ICD-10-CM | POA: Diagnosis not present

## 2021-10-24 HISTORY — DX: Type 2 diabetes mellitus without complications: E11.9

## 2021-10-24 LAB — POCT FASTING CBG KUC MANUAL ENTRY: POCT Glucose (KUC): 169 mg/dL — AB (ref 70–99)

## 2021-10-24 MED ORDER — NOVOLOG FLEXPEN 100 UNIT/ML ~~LOC~~ SOPN: 8.0000 [IU] | PEN_INJECTOR | Freq: Three times a day (TID) | SUBCUTANEOUS | 0 refills | Status: DC

## 2021-10-24 NOTE — Telephone Encounter (Signed)
Patient needed Novolog insulin, but went back to Urgent Care today to have it prescribed. I reminded her to bring her meter to her visit tomorrow with Dr. Marlou Sa. She says she was just diagnosed with diabetes on 10/10/21. I urged her to call with amy questions and to make an appointment with me after she sees the doctor. She verbalized understanding.

## 2021-10-24 NOTE — ED Triage Notes (Signed)
Pt c/o being out of insulin. States newly dx diabetic. States was recently admitted and only given 2 weeks worth of medication. Has apt with "medical center" tomorrow and tried calling them to let them know she is out and they were "not concerned." Denies pain. Has blurry vision.

## 2021-10-24 NOTE — ED Provider Notes (Signed)
[facility]    CSN: [id number] Arrival date & time: 10/24/21  1137      History   Chief Complaint Chief Complaint  Patient presents with   insulin refill    HPI Sierra King is a 46 y.o. female.   Patient here today for insulin refill.  She reports that she is a newly diagnosed diabetic and was recently seen in the emergency room with ketoacidosis and was given medication to last about 2 weeks.  She does have follow-up with internal medicine tomorrow and tried to get refill from their office but was unable to get insulin as requested.  She reports overall she is doing well with medications and denies any hypoglycemia, nausea, vomiting or other concerning symptoms.  The history is provided by the patient.    Past Medical History:  Diagnosis Date   Allergy    Anemia    Arthritis    Diabetes mellitus without complication ([place])    Fibroids    [place] 08/07/2016   Hypertension     Patient Active Problem List   Diagnosis Date Noted   DKA (diabetic ketoacidosis) ([place]) 10/10/2021   Primary osteoarthritis of both knees 06/20/2021   Impingement syndrome, shoulder, right 06/20/2021   Essential hypertension 11/27/2018   Fibroids 08/07/2016    Past Surgical History:  Procedure Laterality Date   BREAST LUMPECTOMY Right    CESAREAN SECTION     IR ANGIOGRAM PELVIS SELECTIVE OR SUPRASELECTIVE  08/07/2016   IR ANGIOGRAM PELVIS SELECTIVE OR SUPRASELECTIVE  08/07/2016   IR ANGIOGRAM SELECTIVE EACH ADDITIONAL VESSEL  08/07/2016   IR ANGIOGRAM SELECTIVE EACH ADDITIONAL VESSEL  08/07/2016   IR EMBO TUMOR ORGAN ISCHEMIA INFARCT INC GUIDE ROADMAPPING  08/07/2016   IR RADIOLOGIST EVAL & MGMT  05/16/2016   IR RADIOLOGIST EVAL & MGMT  09/12/2016   IR RADIOLOGIST EVAL & MGMT  02/27/2017   IR US GUIDE VASC ACCESS LEFT  08/07/2016   IR US GUIDE VASC ACCESS RIGHT  08/07/2016   UTERINE ARTERY EMBOLIZATION  08/07/2016   WISDOM TOOTH EXTRACTION      OB History   No obstetric history on file.       Home Medications    Prior to Admission medications   Medication Sig Start Date End Date Taking? Authorizing Provider  amLODipine (NORVASC) 5 MG tablet Take 5 mg by mouth daily. 10/09/21   [provider]  blood glucose meter kit and supplies KIT Use up to four times daily as directed. 10/12/21   Delene Ruffini, MD  hydrochlorothiazide (HYDRODIURIL) 25 MG tablet Take 25 mg by mouth daily. 10/09/21   [provider]  ibuprofen (ADVIL) 200 MG tablet Take 200 mg by mouth every 6 (six) hours as needed for headache or mild pain.    [provider]  insulin aspart (NOVOLOG FLEXPEN) 100 UNIT/ML FlexPen Inject 8 Units into the skin with breakfast, with lunch, and with evening meal. 10/24/21 11/23/21  Francene Finders, PA-C  insulin glargine (LANTUS) 100 unit/mL SOPN Inject 30 Units into the skin daily. 10/12/21 11/11/21  Delene Ruffini, MD  Insulin Pen Needle 32G X 4 MM MISC Use 4 (four) times daily as needed. 10/12/21   Delene Ruffini, MD  metFORMIN (GLUCOPHAGE) 500 MG tablet Take 1 tablet (500 mg total) by mouth daily with breakfast. 10/12/21 11/11/21  Delene Ruffini, MD    Family History Family History  Problem Relation Age of Onset   Diabetes Mother    Hypertension Mother    Diabetes  Father    Hypertension Father     Social History Social History   Tobacco Use   Smoking status: Never   Smokeless tobacco: Never  Vaping Use   Vaping Use: Never used  Substance Use Topics   Alcohol use: Yes    Comment: occasional glass of wine or beer   Drug use: Yes    Types: Marijuana    Comment: OCASSIONAL     Allergies   Latex   Review of Systems Review of Systems  Constitutional:  Negative for chills and fever.  Eyes:  Negative for discharge and redness.  Gastrointestinal:  Negative for nausea and vomiting.     Physical Exam Triage Vital Signs ED Triage Vitals [10/24/21 1216]  Enc Vitals Group     BP 121/80     Pulse Rate 100     Resp 16      Temp 98.4 F (36.9 C)     Temp Source Oral     SpO2 96 %     Weight      Height      Head Circumference      Peak Flow      Pain Score 0     Pain Loc      Pain Edu?      Excl. in [place]?    No data found.  Updated Vital Signs BP 121/80 (BP Location: Left Arm)   Pulse 100   Temp 98.4 F (36.9 C) (Oral)   Resp 16   LMP 10/10/2021   SpO2 96%      Physical Exam Vitals and nursing note reviewed.  Constitutional:      General: She is not in acute distress.    Appearance: Normal appearance. She is not ill-appearing.  HENT:     Head: Normocephalic and atraumatic.  Eyes:     Conjunctiva/sclera: Conjunctivae normal.  Cardiovascular:     Rate and Rhythm: Normal rate.  Pulmonary:     Effort: Pulmonary effort is normal.  Neurological:     Mental Status: She is alert.  Psychiatric:        Mood and Affect: Mood normal.        Behavior: Behavior normal.        Thought Content: Thought content normal.      UC Treatments / Results  Labs (all labs ordered are listed, but only abnormal results are displayed) Labs Reviewed  POCT FASTING CBG [facility] MANUAL ENTRY - Abnormal; Notable for the following components:      Result Value   POCT Glucose ([facility]) 169 (*)    All other components within normal limits    EKG   Radiology No results found.  Procedures Procedures (including critical care time)  Medications Ordered in UC Medications - No data to display  Initial Impression / Assessment and Plan / UC Course  I have reviewed the triage vital signs and the nursing notes.  Pertinent labs & imaging results that were available during my care of the patient were reviewed by me and considered in my medical decision making (see chart for details).    Insulin refilled at prior dose as requested.  Recommended she keep appointment with internal medicine tomorrow and follow-up with any further concerns.  Final Clinical Impressions(s) / UC Diagnoses   Final diagnoses:  Diabetes  mellitus type 2, insulin dependent [facility])   Discharge Instructions   None    ED Prescriptions     Medication Sig Dispense Auth. Provider   insulin  aspart (NOVOLOG FLEXPEN) 100 UNIT/ML FlexPen Inject 8 Units into the skin with breakfast, with lunch, and with evening meal. 3 mL Francene Finders, PA-C      PDMP not reviewed this encounter.   Francene Finders, PA-C 10/24/21 1336

## 2021-10-25 ENCOUNTER — Ambulatory Visit (INDEPENDENT_AMBULATORY_CARE_PROVIDER_SITE_OTHER): Payer: 59 | Admitting: Internal Medicine

## 2021-10-25 ENCOUNTER — Encounter: Payer: Self-pay | Admitting: Internal Medicine

## 2021-10-25 ENCOUNTER — Other Ambulatory Visit (HOSPITAL_COMMUNITY): Payer: Self-pay

## 2021-10-25 ENCOUNTER — Other Ambulatory Visit: Payer: Self-pay

## 2021-10-25 VITALS — BP 120/73 | HR 103 | Temp 98.2°F | Ht 60.0 in | Wt 223.7 lb

## 2021-10-25 DIAGNOSIS — R635 Abnormal weight gain: Secondary | ICD-10-CM | POA: Diagnosis not present

## 2021-10-25 DIAGNOSIS — E139 Other specified diabetes mellitus without complications: Secondary | ICD-10-CM

## 2021-10-25 DIAGNOSIS — I10 Essential (primary) hypertension: Secondary | ICD-10-CM | POA: Diagnosis not present

## 2021-10-25 DIAGNOSIS — Z794 Long term (current) use of insulin: Secondary | ICD-10-CM | POA: Diagnosis not present

## 2021-10-25 DIAGNOSIS — Z0000 Encounter for general adult medical examination without abnormal findings: Secondary | ICD-10-CM | POA: Insufficient documentation

## 2021-10-25 DIAGNOSIS — Z7984 Long term (current) use of oral hypoglycemic drugs: Secondary | ICD-10-CM | POA: Diagnosis not present

## 2021-10-25 DIAGNOSIS — E119 Type 2 diabetes mellitus without complications: Secondary | ICD-10-CM | POA: Diagnosis not present

## 2021-10-25 DIAGNOSIS — Z1159 Encounter for screening for other viral diseases: Secondary | ICD-10-CM

## 2021-10-25 LAB — GLUCOSE, CAPILLARY: Glucose-Capillary: 123 mg/dL — ABNORMAL HIGH (ref 70–99)

## 2021-10-25 MED ORDER — CLOTRIMAZOLE-BETAMETHASONE 1-0.05 % EX CREA
1.0000 | TOPICAL_CREAM | Freq: Two times a day (BID) | CUTANEOUS | 1 refills | Status: DC
  Filled 2021-10-25: qty 15, 30d supply, fill #0
  Filled 2021-12-05: qty 15, 30d supply, fill #1

## 2021-10-25 MED ORDER — NOVOLOG FLEXPEN 100 UNIT/ML ~~LOC~~ SOPN: 12.0000 [IU] | PEN_INJECTOR | Freq: Three times a day (TID) | SUBCUTANEOUS | 1 refills | Status: DC

## 2021-10-25 MED ORDER — INSULIN GLARGINE 100 UNITS/ML SOLOSTAR PEN
30.0000 [IU] | PEN_INJECTOR | Freq: Every day | SUBCUTANEOUS | 0 refills | Status: DC
  Filled 2021-10-25 – 2021-11-15 (×2): qty 9, 30d supply, fill #0

## 2021-10-25 NOTE — Assessment & Plan Note (Addendum)
Patient presents for hospital follow-up today. Patient is newly diagnosed with T2DM with recent hospitalization two weeks ago for DKA. Patient initially presented with blurry vision, fatigue, polyuria and polydipsia. She was discharged on glargine 30U, aspart 8U TID, and metformin 500. Patient reports she increased her insulin regimen to glargine 32U and aspart 12u TID with meals 5 days ago and says this has been working well for her, she denies any lows. Glucometer data shows average 217 and that glucose control has been improving over the last two weeks with better readings since she increased her insulin last week.  Patient states she has continued to have blurry vision since discharge. She also endorses some bloating, fatigue, lower leg edema, and constipation. She denies polyuria, polydipsia, diarrhea, or abdominal pain.  A&P: Patient is stable on her current insulin regimen and we will continue to work on titration in future visits. We discussed diabetic diet today and provided patient with handout. We will send an ophthalmology referral for ongoing blurry vision. We discussed potentially starting a GLP-1 agonist next visit and patient was agreeable. Plan to change Metformin to XR for better side effect profile, and increase to 500 BID. We will also have her return for CGM and continue with close follow-up as we titrate her medications. Will plan to follow up with lab work today and start her on a statin pending lipid panel results. Consider changing her amlodipine and HCTZ to ACEi/ARB at a future visit for renal protection or if she has proteinuria.  Plan: -Increase Metformin XR to 500 BID -Continue glargine 32U, aspart 12U TID -Ophthalmology referral -Diabetic diet handout given -CBC -BMP -CBG -Urine microalbumin/cr -Lipid profile -TSH

## 2021-10-25 NOTE — Patient Instructions (Addendum)
Thank you for seeing [place] today Sierra King!   Medication Changes We are increasing your Metformin to '500mg'$  twice a day.   Referrals We made a referral to an eye doctor, and you should receive a call from their office.   Labwork We are looking at your blood count, kidney function, thyroid function, cholesterol, and hepatitis C screening today. We will call you with the results.   Please give [place] a call with any questions or concerns!

## 2021-10-25 NOTE — Assessment & Plan Note (Signed)
Hep C ordered today. Patient declined flu shot today, follow up next visit.

## 2021-10-25 NOTE — Progress Notes (Signed)
Subjective:   Patient ID: Sierra King female   DOB: 1975/08/14 46 y.o.   MRN: [id number]  HPI: [name] is a 46 y.o. with past medical history significant for DKA who presents for hospital followup. Please see Plan for individualized problem-based charting.   Patient Active Problem List   Diagnosis Date Noted   Diabetes mellitus ([place]) 10/25/2021   Healthcare maintenance 10/25/2021   DKA (diabetic ketoacidosis) ([place]) 10/10/2021   Primary osteoarthritis of both knees 06/20/2021   Impingement syndrome, shoulder, right 06/20/2021   Essential hypertension 11/27/2018   Fibroids 08/07/2016     Current Outpatient Medications  Medication Sig Dispense Refill   clotrimazole-betamethasone (LOTRISONE) cream Apply to affected area 2 times daily 15 g 1   amLODipine (NORVASC) 5 MG tablet Take 5 mg by mouth daily.     blood glucose meter kit and supplies KIT Use up to four times daily as directed. 1 each 0   hydrochlorothiazide (HYDRODIURIL) 25 MG tablet Take 25 mg by mouth daily.     ibuprofen (ADVIL) 200 MG tablet Take 200 mg by mouth every 6 (six) hours as needed for headache or mild pain.     insulin aspart (NOVOLOG FLEXPEN) 100 UNIT/ML FlexPen Inject 12 Units into the skin with breakfast, with lunch, and with evening meal. 9 mL 1   insulin glargine (LANTUS) 100 unit/mL SOPN Inject 30 Units into the skin daily. 9 mL 0   Insulin Pen Needle 32G X 4 MM MISC Use 4 (four) times daily as needed. 200 each 0   metFORMIN (GLUCOPHAGE) 500 MG tablet Take 1 tablet (500 mg total) by mouth daily with breakfast. 30 tablet 0   No current facility-administered medications for this visit.     Review of Systems: Review of systems is negative other than what is noted in individual problem-based charting.    Objective:   Physical Exam: Vitals:   10/25/21 1500  BP: 120/73  Pulse: (!) 103  Temp: 98.2 F (36.8 C)  TempSrc: Oral  SpO2: 100%  Weight: 223 lb 11.2 oz (101.5 kg)   Height: 5' (1.524 m)   Physical Exam Vitals reviewed.  Constitutional:      General: She is not in acute distress. Cardiovascular:     Rate and Rhythm: Normal rate and regular rhythm.     Heart sounds: No murmur heard. Abdominal:     General: There is no distension.     Palpations: Abdomen is soft.     Tenderness: There is no abdominal tenderness.  Musculoskeletal:     Comments: 1+ edema in bilateral lower extremities  Skin:    General: Skin is warm and dry.  Neurological:     Mental Status: She is alert and oriented to person, place, and time.      Assessment & Plan:   Diabetes mellitus [facility]) Patient presents for hospital follow-up today. Patient is newly diagnosed with T2DM with recent hospitalization two weeks ago for DKA. Patient initially presented with blurry vision, fatigue, polyuria and polydipsia. She was discharged on glargine 30U, aspart 8U TID, and metformin 500. Patient reports she increased her insulin regimen to glargine 32U and aspart 12u TID with meals 5 days ago and says this has been working well for her, she denies any lows. Glucometer data shows average 217 and that glucose control has been improving over the last two weeks with better readings since she increased her insulin last week.  Patient states she has continued to have blurry  vision since discharge. She also endorses some bloating, fatigue, lower leg edema, and constipation. She denies polyuria, polydipsia, diarrhea, or abdominal pain.  A&P: Patient is stable on her current insulin regimen and we will continue to work on titration in future visits. We discussed diabetic diet today and provided patient with handout. We will send an ophthalmology referral for ongoing blurry vision. We discussed potentially starting a GLP-1 agonist next visit and patient was agreeable. Plan to change Metformin to XR for better side effect profile, and increase to 500 BID. We will also have her return for CGM and continue with  close follow-up as we titrate her medications. Will plan to follow up with lab work today and start her on a statin pending lipid panel results. Consider changing her amlodipine and HCTZ to ACEi/ARB at a future visit for renal protection or if she has proteinuria.  Plan: -Increase Metformin XR to 500 BID -Continue glargine 32U, aspart 12U TID -Ophthalmology referral -Diabetic diet handout given -CBC -BMP -CBG -Urine microalbumin/cr -Lipid profile -TSH  Essential hypertension Patient has a history of hypertension managed with amlodipine 37m and HCTZ 261m Patient reports she feels well on this regimen and denies dizziness or lightheadedness. BP in office today was well-controlled at 120/73. Last BMP showed creatinine was 0.85.  Plan to continue current medications, consider switching to ACEi/ARB at a future visit or if patient has proteinuria.  Plan: -Continue amlodipine 33m41mnd HCTZ 233m4mMP today   Healthcare maintenance Hep C ordered today. Patient declined flu shot today, follow up next visit.

## 2021-10-25 NOTE — Assessment & Plan Note (Signed)
Patient has a history of hypertension managed with amlodipine '5mg'$  and HCTZ '25mg'$ . Patient reports she feels well on this regimen and denies dizziness or lightheadedness. BP in office today was well-controlled at 120/73. Last BMP showed creatinine was 0.85.  Plan to continue current medications, consider switching to ACEi/ARB at a future visit or if patient has proteinuria.  Plan: -Continue amlodipine '5mg'$  and HCTZ '25mg'$  -BMP today

## 2021-10-26 ENCOUNTER — Telehealth: Payer: Self-pay

## 2021-10-26 ENCOUNTER — Other Ambulatory Visit (HOSPITAL_COMMUNITY): Payer: Self-pay

## 2021-10-26 DIAGNOSIS — E139 Other specified diabetes mellitus without complications: Secondary | ICD-10-CM

## 2021-10-26 LAB — MICROALBUMIN / CREATININE URINE RATIO
Creatinine, Urine: 74.4 mg/dL
Microalb/Creat Ratio: <4 mg/g creat (ref 0–29)
Microalbumin, Urine: <3 ug/mL

## 2021-10-26 MED ORDER — NOVOLOG FLEXPEN 100 UNIT/ML ~~LOC~~ SOPN
12.0000 [IU] | PEN_INJECTOR | Freq: Three times a day (TID) | SUBCUTANEOUS | 1 refills | Status: DC
  Filled 2021-10-26: qty 9, 25d supply, fill #0
  Filled 2021-11-15 – 2021-12-23 (×3): qty 9, 25d supply, fill #1

## 2021-10-26 NOTE — Telephone Encounter (Signed)
Pt is  requesting  a call back she has a few question about her meds .Marland KitchenMarland Kitchen

## 2021-10-26 NOTE — Telephone Encounter (Signed)
Pt returned call about novolog medication. Informed pt that novolog is ready for pickup at [facility] for $1.33.

## 2021-10-26 NOTE — Telephone Encounter (Signed)
Returned called to patient, difficulty picking up novolog and has been using her lantus in the evening as well as with meals. Discussed not using it for short acting purposes. She was unable to pickup her novolog as it was sent to two different pharmacies.   Will [facility] at [facility] and confirm insurance will cover novolog at [facility]. Also discussed need to redraw labs since hers hemolyzed.

## 2021-10-26 NOTE — Telephone Encounter (Signed)
Returned call to patient. No answer. Left message on VM requesting return call.  °

## 2021-10-27 ENCOUNTER — Other Ambulatory Visit (HOSPITAL_COMMUNITY): Payer: Self-pay

## 2021-10-27 MED ORDER — METFORMIN HCL ER 500 MG PO TB24
500.0000 mg | ORAL_TABLET | Freq: Two times a day (BID) | ORAL | 2 refills | Status: DC
  Filled 2021-10-27: qty 60, 30d supply, fill #0
  Filled 2021-12-05: qty 60, 30d supply, fill #1

## 2021-10-27 NOTE — Progress Notes (Signed)
Urine microalbumin/creatinine ratio is within normal limits. No change to management at this time.

## 2021-10-28 NOTE — Progress Notes (Signed)
Internal Medicine Clinic Attending ? ?Case discussed with Dr. Dean  At the time of the visit.  We reviewed the resident?s history and exam and pertinent patient test results.  I agree with the assessment, diagnosis, and plan of care documented in the resident?s note.  ?

## 2021-10-31 ENCOUNTER — Other Ambulatory Visit: Payer: 59

## 2021-10-31 ENCOUNTER — Ambulatory Visit: Payer: 59 | Admitting: Family Medicine

## 2021-10-31 DIAGNOSIS — Z1159 Encounter for screening for other viral diseases: Secondary | ICD-10-CM | POA: Diagnosis not present

## 2021-10-31 DIAGNOSIS — E139 Other specified diabetes mellitus without complications: Secondary | ICD-10-CM

## 2021-10-31 DIAGNOSIS — R635 Abnormal weight gain: Secondary | ICD-10-CM | POA: Diagnosis not present

## 2021-11-01 ENCOUNTER — Telehealth: Payer: Self-pay

## 2021-11-01 NOTE — Telephone Encounter (Signed)
Sierra King asks if she should take her long acting insulin when her PM blood sugars are 123/ 65/100 before/before and after eating dinner which was later in the evening and also before bed. She had no symptoms of hypoglycemia and skipped her long acting. Next morning  her blood sugar was 153, and that day's readings were 159.92-128, 140.   I advised that she is doing a great job at caring for her diabetes, her mealtime insulin needs to match her food/meal size and activity, her current insulin regimen may need to be adjusted to prevent hypoglycemia and most often she should eat to get sugar up and then take her regularly scheduled long acting dose, but at least take half her long acting insulin rather than skipping it. I encouraged her to schedule an appointment with myself and the doctor as soon as possible. She verbalized understanding and her call was transferred to the front office to schedule appointments.

## 2021-11-01 NOTE — Telephone Encounter (Signed)
Pt has questions about her BS's and insulin. I will ask Butch Penny Plyler,diabetes coordinator,to call pt.

## 2021-11-01 NOTE — Telephone Encounter (Signed)
Return pt's call - no answer; left message the office returned her call and to call back if needed.

## 2021-11-01 NOTE — Telephone Encounter (Signed)
Pt is requesting a call back .Sierra King She is needing clarification on her insulins

## 2021-11-02 LAB — CBC
Hematocrit: 30.6 % — ABNORMAL LOW (ref 34.0–46.6)
Hemoglobin: 10.2 g/dL — ABNORMAL LOW (ref 11.1–15.9)
MCH: 31.6 pg (ref 26.6–33.0)
MCHC: 33.3 g/dL (ref 31.5–35.7)
MCV: 95 fL (ref 79–97)
Platelets: 281 10*3/uL (ref 150–450)
RBC: 3.23 x10E6/uL — ABNORMAL LOW (ref 3.77–5.28)
RDW: 14.9 % (ref 11.7–15.4)
WBC: 8.6 10*3/uL (ref 3.4–10.8)

## 2021-11-02 LAB — HCV AB W REFLEX TO QUANT PCR: HCV Ab: NONREACTIVE

## 2021-11-02 LAB — BMP8+ANION GAP
Anion Gap: 15 mmol/L (ref 10.0–18.0)
BUN/Creatinine Ratio: 18 (ref 9–23)
BUN: 10 mg/dL (ref 6–24)
CO2: 24 mmol/L (ref 20–29)
Calcium: 9.8 mg/dL (ref 8.7–10.2)
Chloride: 100 mmol/L (ref 96–106)
Creatinine, Ser: 0.57 mg/dL (ref 0.57–1.00)
Glucose: 90 mg/dL (ref 70–99)
Potassium: 3.8 mmol/L (ref 3.5–5.2)
Sodium: 139 mmol/L (ref 134–144)
eGFR: 114 mL/min/{1.73_m2} (ref >59)

## 2021-11-02 LAB — LIPID PANEL
Chol/HDL Ratio: 3.2 ratio (ref 0.0–4.4)
Cholesterol, Total: 186 mg/dL (ref 100–199)
HDL: 58 mg/dL (ref >39)
LDL Chol Calc (NIH): 113 mg/dL — ABNORMAL HIGH (ref 0–99)
Triglycerides: 84 mg/dL (ref 0–149)
VLDL Cholesterol Cal: 15 mg/dL (ref 5–40)

## 2021-11-02 LAB — TSH: TSH: 1.61 u[IU]/mL (ref 0.450–4.500)

## 2021-11-02 LAB — HCV INTERPRETATION

## 2021-11-03 NOTE — Progress Notes (Signed)
ASCVD risk 3.4%. With history of diabetes, will start rosuvastatin 20 mg daily.  CBC shows microcytic anemia but no concerns for bleeding were discussed at last OV. Will plan to check repeat CBC at next OV.  Hepatitis C panel negative.  BMP unremarkable.

## 2021-11-04 ENCOUNTER — Telehealth: Payer: Self-pay | Admitting: Internal Medicine

## 2021-11-04 ENCOUNTER — Other Ambulatory Visit (HOSPITAL_COMMUNITY): Payer: Self-pay

## 2021-11-04 MED ORDER — ROSUVASTATIN CALCIUM 10 MG PO TABS
10.0000 mg | ORAL_TABLET | Freq: Every day | ORAL | 11 refills | Status: DC
  Filled 2021-11-04 – 2021-11-15 (×2): qty 30, 30d supply, fill #0
  Filled 2021-12-23: qty 30, 30d supply, fill #1
  Filled 2022-01-18: qty 30, 30d supply, fill #2
  Filled 2022-02-20: qty 30, 30d supply, fill #3
  Filled 2022-03-19: qty 30, 30d supply, fill #4

## 2021-11-04 NOTE — Telephone Encounter (Signed)
Spoke with Sierra King about her lab results from her recent OV and that I am sending in a statin medication for her to start taking daily to help decrease her risk of future ASCVD events to which she is in agreement to. We discussed that she did have one overnight hypoglycemic event to 65 which responded to her eating. I clarified that her long acting insulin is meant to cover her over the day and should not drastically drop her blood sugar when she takes it, and to continue taking this. She has only had one hypoglycemic event.  She states that she will contact the front desk now to reschedule her follow up visit with me and schedule with Butch Penny.  All questions were addressed.  Farrel Gordon, DO

## 2021-11-15 ENCOUNTER — Other Ambulatory Visit (HOSPITAL_COMMUNITY): Payer: Self-pay

## 2021-11-21 ENCOUNTER — Ambulatory Visit (INDEPENDENT_AMBULATORY_CARE_PROVIDER_SITE_OTHER): Payer: 59 | Admitting: Dietician

## 2021-11-21 ENCOUNTER — Encounter: Payer: Self-pay | Admitting: Dietician

## 2021-11-21 ENCOUNTER — Encounter: Payer: Self-pay | Admitting: Student

## 2021-11-21 ENCOUNTER — Ambulatory Visit (INDEPENDENT_AMBULATORY_CARE_PROVIDER_SITE_OTHER): Payer: 59 | Admitting: Student

## 2021-11-21 ENCOUNTER — Other Ambulatory Visit (HOSPITAL_COMMUNITY): Payer: Self-pay

## 2021-11-21 VITALS — BP 118/83 | HR 97 | Temp 98.1°F | Ht 61.0 in | Wt 224.0 lb

## 2021-11-21 DIAGNOSIS — E119 Type 2 diabetes mellitus without complications: Secondary | ICD-10-CM

## 2021-11-21 DIAGNOSIS — E139 Other specified diabetes mellitus without complications: Secondary | ICD-10-CM

## 2021-11-21 DIAGNOSIS — Z7984 Long term (current) use of oral hypoglycemic drugs: Secondary | ICD-10-CM | POA: Diagnosis not present

## 2021-11-21 DIAGNOSIS — Z1211 Encounter for screening for malignant neoplasm of colon: Secondary | ICD-10-CM

## 2021-11-21 DIAGNOSIS — Z23 Encounter for immunization: Secondary | ICD-10-CM

## 2021-11-21 DIAGNOSIS — Z794 Long term (current) use of insulin: Secondary | ICD-10-CM

## 2021-11-21 DIAGNOSIS — E669 Obesity, unspecified: Secondary | ICD-10-CM | POA: Insufficient documentation

## 2021-11-21 DIAGNOSIS — Z6841 Body Mass Index (BMI) 40.0 and over, adult: Secondary | ICD-10-CM | POA: Diagnosis not present

## 2021-11-21 DIAGNOSIS — Z0000 Encounter for general adult medical examination without abnormal findings: Secondary | ICD-10-CM

## 2021-11-21 DIAGNOSIS — E785 Hyperlipidemia, unspecified: Secondary | ICD-10-CM | POA: Diagnosis not present

## 2021-11-21 DIAGNOSIS — I10 Essential (primary) hypertension: Secondary | ICD-10-CM

## 2021-11-21 DIAGNOSIS — E66813 Obesity, class 3: Secondary | ICD-10-CM

## 2021-11-21 MED ORDER — TRULICITY 0.75 MG/0.5ML ~~LOC~~ SOAJ: 0.7500 mg | SUBCUTANEOUS | 1 refills | Status: DC

## 2021-11-21 MED ORDER — TRULICITY 0.75 MG/0.5ML ~~LOC~~ SOAJ
0.7500 mg | SUBCUTANEOUS | 1 refills | Status: DC
  Filled 2021-11-21 – 2021-12-23 (×4): qty 2, 28d supply, fill #0

## 2021-11-21 NOTE — Assessment & Plan Note (Signed)
BMI 42.  Patient used to follow [facility] sky clinic but has lost to follow-up.  She would like to follow-up with another weight loss clinic.  -Ambulatory referral for medical weight management -Follow-up with Butch Penny for dietitian -Started Trulicity 7.07 mg weekly

## 2021-11-21 NOTE — Assessment & Plan Note (Signed)
-  Tdap vaccine given today -Referred to GI for colonoscopy -Patient will follow-up with her GYN for Pap smear

## 2021-11-21 NOTE — Patient Instructions (Signed)
Ms. Shartzer,  It was a pleasure seeing you here in the clinic today.  Here is a summary what we talked about:  1.  Diabetes: You were doing a good job keeping your sugar under control.  Please continue 32 units of Lantus once a day and 12 units of aspart 3 times a day.  Continue metformin for 500 mg twice a day.    I will start you on Trulicity 0.5 mg once a week.  You may experience some stomach upset side effects.  Please let us know if the symptoms are not tolerable.  Please return in 4 weeks for [place] to evaluate for diabetes follow-up.  2.  Your blood pressure is well controlled.  Please continue HCTZ and amlodipine.  3.  Please continue Crestor 10 mg for your cholesterol  4.  I placed a referral to a weight loss clinic  5.  I placed a referral to a gastroenterologist for a colonoscopy.  6.  Please see your GYN doctor for Pap smear  Return in 4 weeks  Take care,  Dr. Alfonse Spruce

## 2021-11-21 NOTE — Assessment & Plan Note (Signed)
New diagnosis of type 2 diabetes with DKA in September.  A1c 12.9 1 month ago.  Current regimen -Metformin for 100 mg twice daily -Lantus 32 units daily -Aspart 4 units 3 times daily with meals  Patient reports compliant to her insulin regimen.  Her glucometer report showed average CBGs of 150.  She has 2 low values in the 60s but was asymptomatic.  She understands to not give short acting insulin when she does not eat.  She states that her vision is improving.  She denies any wounds or sores on his feet.  Report neuropathy of her legs.  -Continue current insulin regimen and metformin -Added Trulicity 8.32 mg weekly for diabetes and weight loss.  Advised patient on side effects.  Patient has no history of pancreatitis or thyroid disease -No SGLT2 given DKA -Foot exam unremarkable today -Patient is supposed to follow-up with Dr. Katy Fitch but he is fully booked.  We will find a different ophthalmologist for her -Follow-up in 4 weeks

## 2021-11-21 NOTE — Assessment & Plan Note (Signed)
Patient was started on Crestor 10 mg with LDL 113.  -Continue current regimen.  Recheck LDL in 3 months.  Goal less than 100.

## 2021-11-21 NOTE — Progress Notes (Signed)
CC: Follow-up on diabetes  HPI:  Sierra King is a 46 y.o. with past medical history of recent diagnosis of type 2 diabetes and DKA in September 2023, hypertension, hyperlipidemia, obesity who presents to the clinic for 4 weeks follow-up on her diabetes.  Please see problem based charting for details  Past Medical History:  Diagnosis Date   Allergy    Anemia    Arthritis    Diabetes mellitus without complication ([place])    Fibroids    [place] 08/07/2016   Hypertension    Review of Systems:  per HPI  Physical Exam:  Vitals:   11/21/21 1538  BP: 118/83  Pulse: 97  Temp: 98.1 F (36.7 C)  TempSrc: Oral  SpO2: 100%  Weight: 224 lb (101.6 kg)  Height: '5\' 1"'$  (1.549 m)   Physical Exam Constitutional:      General: She is not in acute distress.    Appearance: She is not ill-appearing.  Eyes:     General:        Right eye: No discharge.        Left eye: No discharge.     Conjunctiva/sclera: Conjunctivae normal.  Cardiovascular:     Rate and Rhythm: Normal rate and regular rhythm.  Pulmonary:     Effort: Pulmonary effort is normal. No respiratory distress.     Breath sounds: Normal breath sounds. No wheezing.  Abdominal:     General: Bowel sounds are normal. There is no distension.     Palpations: Abdomen is soft.     Tenderness: There is no abdominal tenderness.  Musculoskeletal:        General: Normal range of motion.  Skin:    General: Skin is warm.     Coloration: Skin is not jaundiced.     Comments: No wound or ulcer seen on bilateral feet.  +2 pedis pulses palpated.  Neurological:     General: No focal deficit present.     Mental Status: She is alert.  Psychiatric:        Mood and Affect: Mood normal.      Assessment & Plan:   See Encounters Tab for problem based charting.  Essential hypertension Blood pressure well controlled 118/83.  Patient is taking HCTZ 25 mg daily and amlodipine 5 mg daily.  BMP on 10/2 was unremarkable.  -No change to  current regimen  HLD (hyperlipidemia) Patient was started on Crestor 10 mg with LDL 113.  -Continue current regimen.  Recheck LDL in 3 months.  Goal less than 100.  Obesity BMI 42.  Patient used to follow [facility] sky clinic but has lost to follow-up.  She would like to follow-up with another weight loss clinic.  -Ambulatory referral for medical weight management -Follow-up with Butch Penny for dietitian -Started Trulicity 6.96 mg weekly  Diabetes mellitus [facility]) New diagnosis of type 2 diabetes with DKA in September.  A1c 12.9 1 month ago.  Current regimen -Metformin for 100 mg twice daily -Lantus 32 units daily -Aspart 4 units 3 times daily with meals  Patient reports compliant to her insulin regimen.  Her glucometer report showed average CBGs of 150.  She has 2 low values in the 60s but was asymptomatic.  She understands to not give short acting insulin when she does not eat.  She states that her vision is improving.  She denies any wounds or sores on his feet.  Report neuropathy of her legs.  -Continue current insulin regimen and metformin -Added Trulicity 2.95 mg weekly  for diabetes and weight loss.  Advised patient on side effects.  Patient has no history of pancreatitis or thyroid disease -No SGLT2 given DKA -Foot exam unremarkable today -Patient is supposed to follow-up with Dr. Katy Fitch but he is fully booked.  We will find a different ophthalmologist for her -Follow-up in 4 weeks  Healthcare maintenance -Tdap vaccine given today -Referred to GI for colonoscopy -Patient will follow-up with her GYN for Pap smear   Patient discussed with Dr. Daryll Drown

## 2021-11-21 NOTE — Assessment & Plan Note (Signed)
Blood pressure well controlled 118/83.  Patient is taking HCTZ 25 mg daily and amlodipine 5 mg daily.  BMP on 10/2 was unremarkable.  -No change to current regimen

## 2021-11-21 NOTE — Progress Notes (Signed)
  Diabetes Self-Management Education  Visit Type:    Appt. Start Time: 1430 Appt. End Time: 5093  11/21/2021  Ms. Sierra King, identified by name and date of birth, is a 46 y.o. female with a diagnosis of Diabetes:  .   ASSESSMENT   Patient was provided with a free trial of Freestyle libre 3 Continuous glucose monitor today per her request. She was educated about how to download the app and apply and start the senor.  Will request prescription.   Estimated body mass index is 42.32 kg/m as calculated from the following:   Height as of an earlier encounter on 11/21/21: '5\' 1"'$  (1.549 m).   Weight as of an earlier encounter on 11/21/21: 224 lb (101.6 kg). Wt Readings from Last 10 Encounters:  11/21/21 224 lb (101.6 kg)  10/25/21 223 lb 11.2 oz (101.5 kg)  10/10/21 208 lb (94.3 kg)  02/27/17 208 lb (94.3 kg)  09/12/16 208 lb (94.3 kg)  08/07/16 210 lb (95.3 kg)  05/16/16 207 lb (93.9 kg)  09/04/12 213 lb (96.6 kg)   BP Readings from Last 3 Encounters:  11/21/21 118/83  10/25/21 120/73  10/24/21 121/80   Lipid Panel     Component Value Date/Time   CHOL 186 10/31/2021 1425   TRIG 84 10/31/2021 1425   HDL 58 10/31/2021 1425   CHOLHDL 3.2 10/31/2021 1425   LDLCALC 113 (H) 10/31/2021 1425   LABVLDL 15 10/31/2021 1425   Lab Results  Component Value Date   HGBA1C 12.9 (H) 10/10/2021        Individualized Plan for Diabetes Self-Management Training:   Learning Objective:  Patient will have a greater understanding of diabetes self-management. Patient education plan is to attend individual and/or group sessions per assessed needs and concerns.   Plan:   There are no Patient Instructions on file for this visit.  Expected Outcomes:     Education material provided: Meal plan card, Snack sheet, and Diabetes Resources  If problems or questions, patient to contact team via:  Phone and Email  Future DSME appointment:  [address] [place], [address] 11/21/2021 4:55 PM.

## 2021-11-24 ENCOUNTER — Ambulatory Visit: Payer: 59 | Admitting: Family Medicine

## 2021-11-28 NOTE — Addendum Note (Signed)
Addended by: Gilles Chiquito B on: 11/28/2021 10:21 AM   Modules accepted: Level of Service

## 2021-11-28 NOTE — Progress Notes (Signed)
Internal Medicine Clinic Attending  Case discussed with Dr. Nguyen  at the time of the visit.  We reviewed the resident's history and exam and pertinent patient test results.  I agree with the assessment, diagnosis, and plan of care documented in the resident's note.  

## 2021-12-05 ENCOUNTER — Telehealth: Payer: Self-pay

## 2021-12-05 ENCOUNTER — Other Ambulatory Visit: Payer: Self-pay | Admitting: Internal Medicine

## 2021-12-05 ENCOUNTER — Other Ambulatory Visit (HOSPITAL_COMMUNITY): Payer: Self-pay

## 2021-12-05 DIAGNOSIS — E139 Other specified diabetes mellitus without complications: Secondary | ICD-10-CM

## 2021-12-05 MED ORDER — INSULIN GLARGINE 100 UNITS/ML SOLOSTAR PEN
32.0000 [IU] | PEN_INJECTOR | Freq: Every day | SUBCUTANEOUS | 0 refills | Status: DC
  Filled 2021-12-05 – 2021-12-06 (×2): qty 9, 28d supply, fill #0

## 2021-12-05 NOTE — Telephone Encounter (Signed)
Pt is requesting a call back .Marland Kitchen She stated that the Thermalito 3  but her insurance covers the [facility] .Marland Kitchen She stated that they told her that a fax was sent over to [place] requesting the device be changed but hme has not seen anything come through .Marland KitchenMarland KitchenMarland Kitchen

## 2021-12-06 ENCOUNTER — Other Ambulatory Visit: Payer: Self-pay | Admitting: Internal Medicine

## 2021-12-06 MED ORDER — DEXCOM G7 SENSOR MISC: 11 refills | Status: DC

## 2021-12-06 NOTE — Telephone Encounter (Signed)
Spoke with patient. She would like a prescription for the Dexcom G7 sensors per her insurance preference. Preferred pharmacy: [facility] for the [place] sensors, she intends to download the app on her phone to use as a receiver.

## 2021-12-06 NOTE — Addendum Note (Signed)
Addended by: Resa Miner on: 12/06/2021 09:12 AM   Modules accepted: Orders

## 2021-12-06 NOTE — Addendum Note (Signed)
Addended by: Renato Battles on: 12/06/2021 04:11 PM   Modules accepted: Orders

## 2021-12-07 ENCOUNTER — Other Ambulatory Visit (HOSPITAL_COMMUNITY): Payer: Self-pay

## 2021-12-09 ENCOUNTER — Other Ambulatory Visit (HOSPITAL_COMMUNITY): Payer: Self-pay

## 2021-12-09 MED ORDER — PENTIPS 32G X 4 MM MISC
1.0000 | Freq: Four times a day (QID) | 0 refills | Status: DC | PRN
  Filled 2021-12-09: qty 100, 25d supply, fill #0

## 2021-12-12 ENCOUNTER — Other Ambulatory Visit (HOSPITAL_COMMUNITY): Payer: Self-pay

## 2021-12-20 ENCOUNTER — Encounter: Payer: 59 | Admitting: Internal Medicine

## 2021-12-23 ENCOUNTER — Other Ambulatory Visit (HOSPITAL_COMMUNITY): Payer: Self-pay

## 2021-12-27 ENCOUNTER — Ambulatory Visit (INDEPENDENT_AMBULATORY_CARE_PROVIDER_SITE_OTHER): Payer: 59 | Admitting: Internal Medicine

## 2021-12-27 ENCOUNTER — Other Ambulatory Visit: Payer: Self-pay | Admitting: Internal Medicine

## 2021-12-27 ENCOUNTER — Encounter: Payer: Self-pay | Admitting: Internal Medicine

## 2021-12-27 ENCOUNTER — Other Ambulatory Visit: Payer: Self-pay

## 2021-12-27 VITALS — BP 111/64 | HR 105 | Temp 98.1°F | Ht 60.0 in | Wt 224.4 lb

## 2021-12-27 DIAGNOSIS — Z794 Long term (current) use of insulin: Secondary | ICD-10-CM

## 2021-12-27 DIAGNOSIS — Z7984 Long term (current) use of oral hypoglycemic drugs: Secondary | ICD-10-CM

## 2021-12-27 DIAGNOSIS — E119 Type 2 diabetes mellitus without complications: Secondary | ICD-10-CM

## 2021-12-27 DIAGNOSIS — I10 Essential (primary) hypertension: Secondary | ICD-10-CM

## 2021-12-27 DIAGNOSIS — E139 Other specified diabetes mellitus without complications: Secondary | ICD-10-CM

## 2021-12-27 DIAGNOSIS — E785 Hyperlipidemia, unspecified: Secondary | ICD-10-CM | POA: Diagnosis not present

## 2021-12-27 MED ORDER — INSULIN GLARGINE 100 UNITS/ML SOLOSTAR PEN: 12.0000 [IU] | PEN_INJECTOR | Freq: Every day | SUBCUTANEOUS | 0 refills | Status: DC

## 2021-12-27 MED ORDER — METFORMIN HCL ER (OSM) 1000 MG PO TB24: 1000.0000 mg | ORAL_TABLET | Freq: Every day | ORAL | 2 refills | Status: DC

## 2021-12-27 MED ORDER — NOVOLOG FLEXPEN 100 UNIT/ML ~~LOC~~ SOPN: 6.0000 [IU] | PEN_INJECTOR | Freq: Two times a day (BID) | SUBCUTANEOUS | 1 refills | Status: DC

## 2021-12-27 MED ORDER — TRULICITY 0.75 MG/0.5ML ~~LOC~~ SOAJ: 1.5000 mg | SUBCUTANEOUS | 1 refills | Status: DC

## 2021-12-27 NOTE — Patient Instructions (Addendum)
Ms. Pulice,  It was a pleasure to care for you today!  Please note the following changes to your medications: Trulicity: increase 1.5 mg weekly. Lantus: Give yourself 12 units every night. Novolog: Give yourself 6 units with lunch and dinner before you eat. Metformin: Change to 1000 mg extended release once daily.  Please follow up in about 1 month for a check of your diabetes.  My best, Dr. Marlou Sa

## 2021-12-27 NOTE — Assessment & Plan Note (Signed)
Last HbA1c in 09/2021 was 12.9%, not yet due for recheck. Current prescribed regimen is trulicity 0.30 mg weekly which was started about 1 month ago, lantus 32 units daily, and metformin 500 mg BID, and novolog 12 units TID with meals. Further clarification reveals however that she does not give her novolog at every meal, and not at her prescribed dose. She gives herself 6-7 units with lunch and dinner, about 2 hours after the meal if her blood sugar is still elevated at that time. She also is giving herself no more than 10 units of Lantus at night over the last 2 weeks and is not giving herself the Lantus every night. She is also only taking her metformin once daily most days due to increased gas and abdominal discomfort associated with the medication.  Review of glucometer showed: Average 143 Very high <1% High 11% In range 88% Low <1% Very low 0%  Plan: Increase trulicity to 1.5 mg weekly. Will adjust insulin regimen: novolog 6 units with lunch and dinner and lantus 12 units daily at night. Will change metformin 500 mg BID to 1000 mg XR daily. Follow up in about 1 month.

## 2021-12-27 NOTE — Assessment & Plan Note (Signed)
BP at goal today, 111/64. Current regimen is amlodipine 5 mg daily, HCTZ 25 mg daily. Plan: Continue current regimen.

## 2021-12-27 NOTE — Assessment & Plan Note (Signed)
Last lipid panel checked 10/2021 with LDL above goal at 113. Current regimen is rosuvastatin 10 mg daily. Plan:Recheck lipid panel at next OV. Continue rosuvastatin 10 mg mg daily.

## 2021-12-27 NOTE — Progress Notes (Signed)
   CC: diabetes follow up  HPI:  [name] is a 46 y.o. person with past medical history as detailed below who presents today for diabetes follow up. Please see problem based charting for detailed assessment and plan.  Past Medical History:  Diagnosis Date   Allergy    Anemia    Arthritis    Diabetes mellitus without complication ([place])    Fibroids    [place] 08/07/2016   Hypertension    Review of Systems:  Negative unless otherwise stated.  Physical Exam:  Vitals:   12/27/21 0908  BP: 111/64  Pulse: (!) 105  Temp: 98.1 F (36.7 C)  TempSrc: Oral  Weight: 224 lb 6.4 oz (101.8 kg)  Height: 5' (1.524 m)   Constitutional:Well appearing in no acute distress. Cardio:Regular rate and rhythm. Pulm:Normal work of breathing on room air. ZDG:[id number] for extremity edema. Skin:Warm and dry. Neuro:Alert and oriented x3. No focal deficit noted. Psych:Pleasant mood and affect.  Assessment & Plan:   See Encounters Tab for problem based charting.  Essential hypertension BP at goal today, 111/64. Current regimen is amlodipine 5 mg daily, HCTZ 25 mg daily. Plan: Continue current regimen.  Diabetes mellitus ([place]) Last HbA1c in 09/2021 was 12.9%, not yet due for recheck. Current prescribed regimen is trulicity 2.59 mg weekly which was started about 1 month ago, lantus 32 units daily, and metformin 500 mg BID, and novolog 12 units TID with meals. Further clarification reveals however that she does not give her novolog at every meal, and not at her prescribed dose. She gives herself 6-7 units with lunch and dinner, about 2 hours after the meal if her blood sugar is still elevated at that time. She also is giving herself no more than 10 units of Lantus at night over the last 2 weeks and is not giving herself the Lantus every night. She is also only taking her metformin once daily most days due to increased gas and abdominal discomfort associated with the medication.  Review of  glucometer showed: Average 143 Very high <1% High 11% In range 88% Low <1% Very low 0%  Plan: Increase trulicity to 1.5 mg weekly. Will adjust insulin regimen: novolog 6 units with lunch and dinner and lantus 12 units daily at night. Will change metformin 500 mg BID to 1000 mg XR daily. Follow up in about 1 month.   HLD (hyperlipidemia) Last lipid panel checked 10/2021 with LDL above goal at 113. Current regimen is rosuvastatin 10 mg daily. Plan:Recheck lipid panel at next OV. Continue rosuvastatin 10 mg mg daily.  Patient discussed with Dr.  Saverio Danker

## 2021-12-28 ENCOUNTER — Other Ambulatory Visit: Payer: Self-pay | Admitting: Internal Medicine

## 2021-12-28 MED ORDER — METFORMIN HCL 1000 MG PO TABS: 1000.0000 mg | ORAL_TABLET | Freq: Every day | ORAL | 2 refills | Status: DC

## 2021-12-28 NOTE — Telephone Encounter (Signed)
Alternative medication ordered at this time.

## 2021-12-28 NOTE — Progress Notes (Signed)
Internal Medicine Clinic Attending ? ?Case discussed with Dr. Dean  At the time of the visit.  We reviewed the resident?s history and exam and pertinent patient test results.  I agree with the assessment, diagnosis, and plan of care documented in the resident?s note.  ?

## 2022-01-09 ENCOUNTER — Encounter: Payer: Self-pay | Admitting: Gastroenterology

## 2022-01-18 ENCOUNTER — Other Ambulatory Visit (HOSPITAL_COMMUNITY): Payer: Self-pay

## 2022-01-18 ENCOUNTER — Other Ambulatory Visit: Payer: Self-pay

## 2022-01-18 ENCOUNTER — Other Ambulatory Visit: Payer: Self-pay | Admitting: Internal Medicine

## 2022-01-18 DIAGNOSIS — Z0000 Encounter for general adult medical examination without abnormal findings: Secondary | ICD-10-CM

## 2022-01-18 MED ORDER — CLOTRIMAZOLE-BETAMETHASONE 1-0.05 % EX CREA
1.0000 | TOPICAL_CREAM | Freq: Two times a day (BID) | CUTANEOUS | 1 refills | Status: DC
  Filled 2022-01-18: qty 15, 8d supply, fill #0
  Filled 2022-02-20: qty 15, 8d supply, fill #1

## 2022-01-31 ENCOUNTER — Other Ambulatory Visit: Payer: Self-pay | Admitting: Internal Medicine

## 2022-01-31 MED ORDER — HYDROCHLOROTHIAZIDE 25 MG PO TABS: 25.0000 mg | ORAL_TABLET | Freq: Every day | ORAL | 3 refills | Status: DC

## 2022-01-31 MED ORDER — AMLODIPINE BESYLATE 5 MG PO TABS: 5.0000 mg | ORAL_TABLET | Freq: Every day | ORAL | 3 refills | Status: DC

## 2022-01-31 NOTE — Telephone Encounter (Signed)
hydrochlorothiazide (HYDRODIURIL) 25 MG tablet    amLODipine (NORVASC) 5 MG tablet   [id number] [place], [place] - [address] (Ph: [phone]

## 2022-02-06 ENCOUNTER — Encounter: Payer: Self-pay | Admitting: Internal Medicine

## 2022-02-06 ENCOUNTER — Ambulatory Visit (INDEPENDENT_AMBULATORY_CARE_PROVIDER_SITE_OTHER): Payer: 59 | Admitting: Internal Medicine

## 2022-02-06 ENCOUNTER — Other Ambulatory Visit: Payer: Self-pay

## 2022-02-06 ENCOUNTER — Ambulatory Visit: Payer: 59 | Admitting: *Deleted

## 2022-02-06 VITALS — BP 130/89 | HR 89 | Temp 98.5°F | Ht 62.0 in | Wt 234.4 lb

## 2022-02-06 VITALS — Ht 61.0 in | Wt 230.0 lb

## 2022-02-06 DIAGNOSIS — E785 Hyperlipidemia, unspecified: Secondary | ICD-10-CM | POA: Diagnosis not present

## 2022-02-06 DIAGNOSIS — E119 Type 2 diabetes mellitus without complications: Secondary | ICD-10-CM | POA: Diagnosis not present

## 2022-02-06 DIAGNOSIS — I10 Essential (primary) hypertension: Secondary | ICD-10-CM | POA: Diagnosis not present

## 2022-02-06 DIAGNOSIS — Z794 Long term (current) use of insulin: Secondary | ICD-10-CM | POA: Diagnosis not present

## 2022-02-06 DIAGNOSIS — Z7984 Long term (current) use of oral hypoglycemic drugs: Secondary | ICD-10-CM | POA: Diagnosis not present

## 2022-02-06 DIAGNOSIS — E139 Other specified diabetes mellitus without complications: Secondary | ICD-10-CM

## 2022-02-06 DIAGNOSIS — Z1211 Encounter for screening for malignant neoplasm of colon: Secondary | ICD-10-CM

## 2022-02-06 LAB — POCT GLYCOSYLATED HEMOGLOBIN (HGB A1C): Hemoglobin A1C: 6.4 % — AB (ref 4.0–5.6)

## 2022-02-06 LAB — GLUCOSE, CAPILLARY: Glucose-Capillary: 107 mg/dL — ABNORMAL HIGH (ref 70–99)

## 2022-02-06 MED ORDER — TRULICITY 3 MG/0.5ML ~~LOC~~ SOAJ
3.0000 mg | SUBCUTANEOUS | 2 refills | Status: DC
  Filled 2022-02-23: qty 2, 28d supply, fill #0
  Filled 2022-03-15 – 2022-03-19 (×2): qty 2, 28d supply, fill #1
  Filled 2022-04-16 – 2022-05-05 (×2): qty 2, 28d supply, fill #2

## 2022-02-06 MED ORDER — NA SULFATE-K SULFATE-MG SULF 17.5-3.13-1.6 GM/177ML PO SOLN: 1.0000 | Freq: Once | ORAL | 0 refills | Status: AC

## 2022-02-06 MED ORDER — DEXCOM G7 SENSOR MISC: 11 refills | Status: DC

## 2022-02-06 NOTE — Progress Notes (Signed)
   CC: DM follow-up  HPI:  [name] is a 47 y.o. person with past medical history as detailed below who presents today for diabetes follow-up and medication refills. Please see problem based charting for detailed assessment and plan.   Past Medical History:  Diagnosis Date   Allergy    Anemia    Arthritis    Diabetes mellitus without complication ([place])    Fibroids    [place] 08/07/2016   Hypertension    Review of Systems:  Negative unless otherwise stated.  Physical Exam:  Vitals:   02/06/22 1013  BP: 130/89  Pulse: 89  Temp: 98.5 F (36.9 C)  TempSrc: Oral  SpO2: 100%  Height: '5\' 2"'$  (1.575 m)   Constitutional:Appears well, stated age. In no acute distress. Cardio:Regular rate and rhythm. No murmurs, rubs, or gallops. Pulm:Clear to auscultation bilaterally. Normal work of breathing on room air. RZN:BVAPOLID for extremity edema. Skin:Warm and dry. Neuro:Alert and oriented x3. No focal deficit noted. Psych:Pleasant mood and affect.  Assessment & Plan:   See Encounters Tab for problem based charting.  Essential hypertension BP above goal today, 130/89. Current regimen is amlodipine 5 mg daily, HCTZ 25 mg daily.  Plan:Continue current regimen.   Diabetes mellitus ([place]) HbA1c last checked 09/2021 12.9%, today is significantly improved to 6.4%. Current regimen includes metformin 0301 mg daily, trulicity 1.5 mg injection weekly, lantus 12 units daily, and novolog 6 units BID with meals. She is taking the trulicity 1.5 mg injection weekly, metformin 1000 mg daily, and is not taking lantus or novolog consistently due to downtrending blood sugar levels. She has been giving herself lantus 14 units daily at bedtime until about 1 week ago, and has not given herself novolog due to improved levels. She checks her blood sugar at home and review of glucometer data for the last 30 days reveals:  Average 145 Very high 1% High 12% In range 86% Low <1% Very low <1%  She  reports some nighttime lows into the 70's, with her highest readings being in the morning where they range 120s-130s with occasional readings in the 140s.  Plan:Continue metformin 1000 mg daily and trulicity 3.0 mg injection weekly. Continue lantus to 12 units daily at bedtime as prescribed and stop novolog 6 units BID with meals. Follow-up in 4 weeks for further adjustments in regimen based on CGM data. Ultimately, the goal is to get her off of insulin if possible.  HLD (hyperlipidemia) Last lipid profile 10/2021 with LDL mildly elevated at 113. Current regimen is rosuvastatin 10 mg daily. She is adherent to the medication. Plan:Continue current regimen. Recheck lipid profile at next OV.  Patient discussed with Dr. Philipp Ovens

## 2022-02-06 NOTE — Assessment & Plan Note (Addendum)
HbA1c last checked 09/2021 12.9%, today is significantly improved to 6.4%. Current regimen includes metformin 0301 mg daily, trulicity 1.5 mg injection weekly, lantus 12 units daily, and novolog 6 units BID with meals. She is taking the trulicity 1.5 mg injection weekly, metformin 1000 mg daily, and is not taking lantus or novolog consistently due to downtrending blood sugar levels. She has been giving herself lantus 14 units daily at bedtime until about 1 week ago, and has not given herself novolog due to improved levels. She checks her blood sugar at home and review of glucometer data for the last 30 days reveals:  Average 145 Very high 1% High 12% In range 86% Low <1% Very low <1%  She reports some nighttime lows into the 70's, with her highest readings being in the morning where they range 120s-130s with occasional readings in the 140s.  Plan:Continue metformin 1000 mg daily and trulicity 3.0 mg injection weekly. Continue lantus to 12 units daily at bedtime as prescribed and stop novolog 6 units BID with meals. Follow-up in 4 weeks for further adjustments in regimen based on CGM data. Ultimately, the goal is to get her off of insulin if possible.

## 2022-02-06 NOTE — Progress Notes (Signed)
Internal Medicine Clinic Attending ? ?Case discussed with Dr. Dean  At the time of the visit.  We reviewed the resident?s history and exam and pertinent patient test results.  I agree with the assessment, diagnosis, and plan of care documented in the resident?s note.  ?

## 2022-02-06 NOTE — Assessment & Plan Note (Signed)
Last lipid profile 10/2021 with LDL mildly elevated at 113. Current regimen is rosuvastatin 10 mg daily. She is adherent to the medication. Plan:Continue current regimen. Recheck lipid profile at next OV.

## 2022-02-06 NOTE — Patient Instructions (Addendum)
Ms. Corcino,  It was a pleasure to care for you today. I'm glad you're doing well!  For your diabetes, please note the following medication changes: INCREASE trulicity to 3.0 mg injection weekly CONTINUE metformin 1000 mg daily CONTINUE lantus 12 units daily at bedtime STOP novolog 6 units twice daily with meals  Please follow up in about 1 month so we can make sure you are doing well with this adjusted regimen for your diabetes.  My best, Dr. Marlou Sa

## 2022-02-06 NOTE — Progress Notes (Signed)
No egg or soy allergy known to patient  No issues known to pt with past sedation with any surgeries or procedures Patient denies ever being told they had issues or difficulty with intubation  No FH of Malignant Hyperthermia Pt is not on diet pills Pt is not on  home 02  Pt is not on blood thinners  Pt denies issues with constipation  Pt is not on dialysis Pt denies any upcoming cardiac testing Pt encouraged to use to use Singlecare or Goodrx to reduce cost  Patient's chart reviewed by Osvaldo Angst CNRA prior to previsit and patient appropriate for the [place].  Previsit completed and red dot placed by patient's name on their procedure day (on provider's schedule).  . Visit by phone Instructions sent by mail  with coupon if applicable and sent my chart  M[place] use in past instructed not do use day of

## 2022-02-06 NOTE — Assessment & Plan Note (Signed)
BP above goal today, 130/89. Current regimen is amlodipine 5 mg daily, HCTZ 25 mg daily.  Plan:Continue current regimen.

## 2022-02-21 ENCOUNTER — Encounter: Payer: Self-pay | Admitting: Gastroenterology

## 2022-02-22 ENCOUNTER — Telehealth: Payer: Self-pay

## 2022-02-22 NOTE — Telephone Encounter (Signed)
Pt is requesting a call back .Sierra King She stated that her trulicity  is $673 and she can't afford that  is even with  the  insurance .Sierra King She is wanting to know if there is something else she can get

## 2022-02-23 ENCOUNTER — Other Ambulatory Visit (HOSPITAL_COMMUNITY): Payer: Self-pay

## 2022-02-24 ENCOUNTER — Other Ambulatory Visit: Payer: Self-pay | Admitting: Internal Medicine

## 2022-02-24 ENCOUNTER — Other Ambulatory Visit (HOSPITAL_COMMUNITY): Payer: Self-pay

## 2022-02-24 NOTE — Telephone Encounter (Signed)
Thank you so much for your assistance [place].

## 2022-02-24 NOTE — Telephone Encounter (Signed)
Medication filled at [facility] for $25 copay on insurance.

## 2022-02-27 ENCOUNTER — Other Ambulatory Visit (HOSPITAL_COMMUNITY): Payer: Self-pay

## 2022-02-28 ENCOUNTER — Ambulatory Visit (AMBULATORY_SURGERY_CENTER): Payer: 59 | Admitting: Gastroenterology

## 2022-02-28 ENCOUNTER — Encounter: Payer: Self-pay | Admitting: Gastroenterology

## 2022-02-28 VITALS — BP 131/68 | HR 77 | Temp 97.7°F | Resp 19 | Ht 61.0 in | Wt 230.0 lb

## 2022-02-28 DIAGNOSIS — E119 Type 2 diabetes mellitus without complications: Secondary | ICD-10-CM | POA: Diagnosis not present

## 2022-02-28 DIAGNOSIS — Z1211 Encounter for screening for malignant neoplasm of colon: Secondary | ICD-10-CM

## 2022-02-28 DIAGNOSIS — I10 Essential (primary) hypertension: Secondary | ICD-10-CM | POA: Diagnosis not present

## 2022-02-28 MED ORDER — SODIUM CHLORIDE 0.9 % IV SOLN: 500.0000 mL | Freq: Once | INTRAVENOUS | Status: DC

## 2022-02-28 NOTE — Progress Notes (Unsigned)
A and O x3. Report to RN. Tolerated MAC anesthesia well. 

## 2022-02-28 NOTE — Op Note (Signed)
[facility] Patient Name: Sierra King Procedure Date: 02/28/2022 10:58 AM MRN: [id number] Endoscopist: Mauri Pole , MD, [id number] Age: 47 Referring MD:  Date of Birth: [birth year] Gender: Female Account #: [id number] Procedure:                Colonoscopy Indications:              Screening for colorectal malignant neoplasm Medicines:                Monitored Anesthesia Care Procedure:                Pre-Anesthesia Assessment:                           - Prior to the procedure, a History and Physical                            was performed, and patient medications and                            allergies were reviewed. The patient's tolerance of                            previous anesthesia was also reviewed. The risks                            and benefits of the procedure and the sedation                            options and risks were discussed with the patient.                            All questions were answered, and informed consent                            was obtained. Prior Anticoagulants: The patient has                            taken no anticoagulant or antiplatelet agents. ASA                            Grade Assessment: III - A patient with severe                            systemic disease. After reviewing the risks and                            benefits, the patient was deemed in satisfactory                            condition to undergo the procedure.                           After obtaining informed consent, the colonoscope  was passed under direct vision. Throughout the                            procedure, the patient's blood pressure, pulse, and                            oxygen saturations were monitored continuously. The                            Olympus [id number]-[id number] (#[id number]) Colonoscope was                            introduced through the anus and advanced to the the                             cecum, identified by appendiceal orifice and                            ileocecal valve. The colonoscopy was performed                            without difficulty. The patient tolerated the                            procedure well. The quality of the bowel                            preparation was good. The ileocecal valve,                            appendiceal orifice, and rectum were photographed. Scope In: 11:05:18 AM Scope Out: 11:22:59 AM Scope Withdrawal Time: 0 hours 12 minutes 36 seconds  Total Procedure Duration: 0 hours 17 minutes 41 seconds  Findings:                 The perianal and digital rectal examinations were                            normal.                           A single localized non-bleeding erosion was found                            in the ascending colon.                           Scattered small-mouthed diverticula were found in                            the sigmoid colon, descending colon, transverse                            colon and ascending colon.  Non-bleeding external and internal hemorrhoids were                            found during retroflexion. The hemorrhoids were                            medium-sized.                           The exam was otherwise without abnormality. Complications:            No immediate complications. Estimated Blood Loss:     Estimated blood loss was minimal. Impression:               - A single erosion in the ascending colon.                           - Diverticulosis in the sigmoid colon, in the                            descending colon, in the transverse colon and in                            the ascending colon.                           - Non-bleeding external and internal hemorrhoids.                           - The examination was otherwise normal.                           - No specimens collected. Recommendation:           - Patient has a contact number available for                             emergencies. The signs and symptoms of potential                            delayed complications were discussed with the                            patient. Return to normal activities tomorrow.                            Written discharge instructions were provided to the                            patient.                           - Resume previous diet.                           - Continue present medications.                           -  Await pathology results.                           - Repeat colonoscopy in 10 years for screening                            purposes. Mauri Pole, MD 02/28/2022 11:31:56 AM This report has been signed electronically.

## 2022-02-28 NOTE — Progress Notes (Unsigned)
Cell phone off per pt Pt's states no medical or surgical changes since previsit or office visit.  

## 2022-02-28 NOTE — Patient Instructions (Signed)
   Handouts on diverticulosis & hemorrhoids given to you today   YOU HAD AN ENDOSCOPIC PROCEDURE TODAY AT [facility]:   Refer to the procedure report that was given to you for any specific questions about what was found during the examination.  If the procedure report does not answer your questions, please call your gastroenterologist to clarify.  If you requested that your care partner not be given the details of your procedure findings, then the procedure report has been included in a sealed envelope for you to review at your convenience later.  YOU SHOULD EXPECT: Some feelings of bloating in the abdomen. Passage of more gas than usual.  Walking can help get rid of the air that was put into your GI tract during the procedure and reduce the bloating. If you had a lower endoscopy (such as a colonoscopy or flexible sigmoidoscopy) you may notice spotting of blood in your stool or on the toilet paper. If you underwent a bowel prep for your procedure, you may not have a normal bowel movement for a few days.  Please Note:  You might notice some irritation and congestion in your nose or some drainage.  This is from the oxygen used during your procedure.  There is no need for concern and it should clear up in a day or so.  SYMPTOMS TO REPORT IMMEDIATELY:  Following lower endoscopy (colonoscopy or flexible sigmoidoscopy):  Excessive amounts of blood in the stool  Significant tenderness or worsening of abdominal pains  Swelling of the abdomen that is new, acute  Fever of 100F or higher  Following upper endoscopy (EGD)  Vomiting of blood or coffee ground material  New chest pain or pain under the shoulder blades  Painful or persistently difficult swallowing  New shortness of breath  Fever of 100F or higher  Black, tarry-looking stools  For urgent or emergent issues, a gastroenterologist can be reached at any hour by calling [phone]. Do not use MyChart messaging for urgent  concerns.    DIET:  We do recommend a small meal at first, but then you may proceed to your regular diet.  Drink plenty of fluids but you should avoid alcoholic beverages for 24 hours.  ACTIVITY:  You should plan to take it easy for the rest of today and you should NOT DRIVE or use heavy machinery until tomorrow (because of the sedation medicines used during the test).    FOLLOW UP: Our staff will call the number listed on your records the next business day following your procedure.  We will call around 7:15- 8:00 am to check on you and address any questions or concerns that you may have regarding the information given to you following your procedure. If we do not reach you, we will leave a message.     If any biopsies were taken you will be contacted by phone or by letter within the next 1-3 weeks.  Please call us at [phone] if you have not heard about the biopsies in 3 weeks.    SIGNATURES/CONFIDENTIALITY: You and/or your care partner have signed paperwork which will be entered into your electronic medical record.  These signatures attest to the fact that that the information above on your After Visit Summary has been reviewed and is understood.  Full responsibility of the confidentiality of this discharge information lies with you and/or your care-partner.

## 2022-02-28 NOTE — Progress Notes (Unsigned)
[facility] History and Physical   Primary Care Physician:  Farrel Gordon, DO   Reason for Procedure:  Colorectal cancer screening  Plan:    Screening colonoscopy with possible interventions as needed     HPI: Sierra King is a very pleasant 47 y.o. female here for screening colonoscopy. Denies any nausea, vomiting, abdominal pain, melena or bright red blood per rectum  The risks and benefits as well as alternatives of endoscopic procedure(s) have been discussed and reviewed. All questions answered. The patient agrees to proceed.    Past Medical History:  Diagnosis Date   Allergy    Anemia    Arthritis    Diabetes mellitus without complication ([place])    Fibroids    [place] 08/07/2016   Hypertension     Past Surgical History:  Procedure Laterality Date   BREAST LUMPECTOMY Right    CESAREAN SECTION     IR ANGIOGRAM PELVIS SELECTIVE OR SUPRASELECTIVE  08/07/2016   IR ANGIOGRAM PELVIS SELECTIVE OR SUPRASELECTIVE  08/07/2016   IR ANGIOGRAM SELECTIVE EACH ADDITIONAL VESSEL  08/07/2016   IR ANGIOGRAM SELECTIVE EACH ADDITIONAL VESSEL  08/07/2016   IR EMBO TUMOR ORGAN ISCHEMIA INFARCT INC GUIDE ROADMAPPING  08/07/2016   IR RADIOLOGIST EVAL & MGMT  05/16/2016   IR RADIOLOGIST EVAL & MGMT  09/12/2016   IR RADIOLOGIST EVAL & MGMT  02/27/2017   IR US GUIDE VASC ACCESS LEFT  08/07/2016   IR US GUIDE VASC ACCESS RIGHT  08/07/2016   UTERINE ARTERY EMBOLIZATION  08/07/2016   WISDOM TOOTH EXTRACTION      Prior to Admission medications   Medication Sig Start Date End Date Taking? Authorizing Provider  blood glucose meter kit and supplies KIT Use up to four times daily as directed. 10/12/21  Yes Delene Ruffini, MD  clotrimazole-betamethasone (LOTRISONE) cream Apply 1 Application topically 2 (two) times daily to affected area. 01/18/22  Yes Katsadouros, Vasilios, MD  Continuous Blood Gluc Sensor (DEXCOM G7 SENSOR) MISC Use to monitor your blood sugar continuously 02/06/22  Yes Farrel Gordon, DO   ibuprofen (ADVIL) 200 MG tablet Take 200 mg by mouth every 6 (six) hours as needed for headache or mild pain.   Yes [provider]  Insulin Pen Needle (PENTIPS) 32G X 4 MM MISC Use 4 (four) times daily as needed. 12/09/21  Yes Farrel Gordon, DO  rosuvastatin (CRESTOR) 10 MG tablet Take 1 tablet (10 mg total) by mouth daily. 11/04/21  Yes Farrel Gordon, DO  amLODipine (NORVASC) 5 MG tablet Take 1 tablet (5 mg total) by mouth daily. 01/31/22   Lacinda Axon, MD  Dulaglutide (TRULICITY) 3 ZJ/6.7HA SOPN Inject 3 mg as directed once a week. 02/06/22   Farrel Gordon, DO  hydrochlorothiazide (HYDRODIURIL) 25 MG tablet Take 1 tablet (25 mg total) by mouth daily. 01/31/22   Lacinda Axon, MD  insulin glargine (LANTUS) 100 unit/mL SOPN Inject 12 Units into the skin daily. 12/27/21   Farrel Gordon, DO  metFORMIN (GLUCOPHAGE) 1000 MG tablet TAKE 1 TABLET BY MOUTH EVERY DAY WITH BREAKFAST 02/27/22   Farrel Gordon, DO    Current Outpatient Medications  Medication Sig Dispense Refill   blood glucose meter kit and supplies KIT Use up to four times daily as directed. 1 each 0   clotrimazole-betamethasone (LOTRISONE) cream Apply 1 Application topically 2 (two) times daily to affected area. 15 g 1   Continuous Blood Gluc Sensor (DEXCOM G7 SENSOR) MISC Use to monitor your blood sugar continuously 3 each 11  ibuprofen (ADVIL) 200 MG tablet Take 200 mg by mouth every 6 (six) hours as needed for headache or mild pain.     Insulin Pen Needle (PENTIPS) 32G X 4 MM MISC Use 4 (four) times daily as needed. 200 each 0   rosuvastatin (CRESTOR) 10 MG tablet Take 1 tablet (10 mg total) by mouth daily. 30 tablet 11   amLODipine (NORVASC) 5 MG tablet Take 1 tablet (5 mg total) by mouth daily. 90 tablet 3   Dulaglutide (TRULICITY) 3 OA/4.1YS SOPN Inject 3 mg as directed once a week. 2 mL 2   hydrochlorothiazide (HYDRODIURIL) 25 MG tablet Take 1 tablet (25 mg total) by mouth daily. 90 tablet 3   insulin glargine (LANTUS)  100 unit/mL SOPN Inject 12 Units into the skin daily. 15 mL 0   metFORMIN (GLUCOPHAGE) 1000 MG tablet TAKE 1 TABLET BY MOUTH EVERY DAY WITH BREAKFAST 90 tablet 1   Current Facility-Administered Medications  Medication Dose Route Frequency Provider Last Rate Last Admin   0.9 %  sodium chloride infusion  500 mL Intravenous Once Mauri Pole, MD        Allergies as of 02/28/2022 - Review Complete 02/28/2022  Allergen Reaction Noted   Latex Itching 05/16/2016    Family History  Problem Relation Age of Onset   Diabetes Mother    Hypertension Mother    Diabetes Father    Hypertension Father    Colon cancer Neg Hx    Colon polyps Neg Hx    Esophageal cancer Neg Hx    Rectal cancer Neg Hx    Stomach cancer Neg Hx     Social History   Socioeconomic History   Marital status: Married    Spouse name: Not on file   Number of children: Not on file   Years of education: Not on file   Highest education level: Not on file  Occupational History   Not on file  Tobacco Use   Smoking status: Never   Smokeless tobacco: Never  Vaping Use   Vaping Use: Never used  Substance and Sexual Activity   Alcohol use: Yes    Comment: occasional glass of wine or beer   Drug use: Yes    Types: Marijuana    Comment: OCASSIONAL   Sexual activity: Yes    Partners: Male    Birth control/protection: None  Other Topics Concern   Not on file  Social History Narrative   Not on file   Social Determinants of Health   Financial Resource Strain: Low Risk  (12/27/2021)   Overall Financial Resource Strain (CARDIA)    Difficulty of Paying Living Expenses: Not very hard  Food Insecurity: No Food Insecurity (02/06/2022)   Hunger Vital Sign    Worried About Running Out of Food in the Last Year: Never true    Ran Out of Food in the Last Year: Never true  Transportation Needs: No Transportation Needs (02/06/2022)   PRAPARE - Hydrologist (Medical): No    Lack of  Transportation (Non-Medical): No  Physical Activity: Insufficiently Active (12/27/2021)   Exercise Vital Sign    Days of Exercise per Week: 7 days    Minutes of Exercise per Session: 20 min  Stress: Stress Concern Present (12/27/2021)   [place]    Feeling of Stress : To some extent  Social Connections: Moderately Integrated (02/06/2022)   Social Connection and Isolation Panel [NHANES]  Frequency of Communication with Friends and Family: More than three times a week    Frequency of Social Gatherings with Friends and Family: More than three times a week    Attends Religious Services: More than 4 times per year    Active Member of [employer] or Organizations: No    Attends Archivist Meetings: Never    Marital Status: Married  Human resources officer Violence: Not At Risk (02/06/2022)   Humiliation, Afraid, Rape, and Kick questionnaire    Fear of Current or Ex-Partner: No    Emotionally Abused: No    Physically Abused: No    Sexually Abused: No    Review of Systems:  All other review of systems negative except as mentioned in the HPI.  Physical Exam: Vital signs in last 24 hours: Blood Pressure 112/69   Pulse 93   Temperature 97.7 F (36.5 C)   Height '5\' 1"'$  (1.549 m)   Weight 230 lb (104.3 kg)   Last Menstrual Period 02/19/2022   Oxygen Saturation 100%   Body Mass Index 43.46 kg/m  General:   Alert, NAD Lungs:  Clear .   Heart:  Regular rate and rhythm Abdomen:  Soft, nontender and nondistended. Neuro/Psych:  Alert and cooperative. Normal mood and affect. A and O x 3  Reviewed labs, radiology imaging, old records and pertinent past GI work up  Patient is appropriate for planned procedure(s) and anesthesia in an ambulatory setting   K. Denzil Magnuson , MD [phone]

## 2022-03-01 ENCOUNTER — Telehealth: Payer: Self-pay | Admitting: *Deleted

## 2022-03-01 NOTE — Telephone Encounter (Signed)
Follow up call attempt.  Unable to leave message mailbox full.

## 2022-03-15 ENCOUNTER — Other Ambulatory Visit: Payer: Self-pay

## 2022-04-03 ENCOUNTER — Encounter: Payer: 59 | Admitting: Student

## 2022-04-05 ENCOUNTER — Encounter (INDEPENDENT_AMBULATORY_CARE_PROVIDER_SITE_OTHER): Payer: Self-pay

## 2022-04-05 ENCOUNTER — Encounter (INDEPENDENT_AMBULATORY_CARE_PROVIDER_SITE_OTHER): Payer: Self-pay | Admitting: Family Medicine

## 2022-04-17 ENCOUNTER — Other Ambulatory Visit (HOSPITAL_COMMUNITY): Payer: Self-pay

## 2022-04-18 ENCOUNTER — Encounter: Payer: 59 | Admitting: Student

## 2022-04-21 ENCOUNTER — Other Ambulatory Visit (HOSPITAL_COMMUNITY): Payer: Self-pay

## 2022-04-21 ENCOUNTER — Telehealth: Payer: Self-pay

## 2022-04-21 DIAGNOSIS — E139 Other specified diabetes mellitus without complications: Secondary | ICD-10-CM

## 2022-04-21 NOTE — Telephone Encounter (Signed)
Pt is needing  a  med change  .Marland Kitchen Her deductible  fr her new insurance has not been met  so her trulicity  is now over [id number]  until she meets the $4000 ... 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

## 2022-04-24 ENCOUNTER — Other Ambulatory Visit (HOSPITAL_COMMUNITY): Payer: Self-pay

## 2022-04-28 ENCOUNTER — Other Ambulatory Visit (HOSPITAL_COMMUNITY): Payer: Self-pay

## 2022-05-05 ENCOUNTER — Other Ambulatory Visit (HOSPITAL_COMMUNITY): Payer: Self-pay

## 2022-05-05 NOTE — Telephone Encounter (Signed)
Patient calling again for alternative to Trulicity. States blood sugars are ranging 150-180. Continues taking metformin daily.

## 2022-05-05 NOTE — Telephone Encounter (Signed)
Spoke with Ms. Mccourt she has enough for the next 3 weeks, will work with pharmacy to find GLP-1 alternative as she notes being at her glucose goal with this.

## 2022-05-23 LAB — HM DIABETES EYE EXAM

## 2022-06-06 ENCOUNTER — Other Ambulatory Visit: Payer: Self-pay

## 2022-06-06 ENCOUNTER — Telehealth: Payer: Self-pay

## 2022-06-06 NOTE — Telephone Encounter (Signed)
Pt states Dulaglutide (TRULICITY) 3 MG/0.5ML SOPN is on back order. Want to know what should she do. Please call back.

## 2022-06-07 ENCOUNTER — Other Ambulatory Visit (HOSPITAL_COMMUNITY): Payer: Self-pay

## 2022-06-07 ENCOUNTER — Other Ambulatory Visit: Payer: Self-pay | Admitting: Student

## 2022-06-07 MED ORDER — TRULICITY 1.5 MG/0.5ML ~~LOC~~ SOAJ
1.5000 mg | SUBCUTANEOUS | 2 refills | Status: AC
  Filled 2022-06-07 – 2022-06-14 (×2): qty 2, 28d supply, fill #0

## 2022-06-07 NOTE — Telephone Encounter (Addendum)
Patient is over due for follow up diabetes visit.

## 2022-06-08 NOTE — Telephone Encounter (Signed)
Hi [name], has Sierra King been informed about her appointment?

## 2022-06-14 ENCOUNTER — Other Ambulatory Visit: Payer: Self-pay

## 2022-06-14 ENCOUNTER — Other Ambulatory Visit (HOSPITAL_COMMUNITY): Payer: Self-pay

## 2022-06-14 ENCOUNTER — Telehealth: Payer: Self-pay

## 2022-06-14 ENCOUNTER — Encounter: Payer: Self-pay | Admitting: Student

## 2022-06-14 ENCOUNTER — Ambulatory Visit (INDEPENDENT_AMBULATORY_CARE_PROVIDER_SITE_OTHER): Payer: 59 | Admitting: Student

## 2022-06-14 VITALS — BP 113/73 | HR 114 | Temp 98.6°F | Ht 61.0 in | Wt 225.4 lb

## 2022-06-14 DIAGNOSIS — E119 Type 2 diabetes mellitus without complications: Secondary | ICD-10-CM | POA: Diagnosis not present

## 2022-06-14 DIAGNOSIS — E785 Hyperlipidemia, unspecified: Secondary | ICD-10-CM | POA: Diagnosis not present

## 2022-06-14 DIAGNOSIS — E139 Other specified diabetes mellitus without complications: Secondary | ICD-10-CM

## 2022-06-14 DIAGNOSIS — I10 Essential (primary) hypertension: Secondary | ICD-10-CM | POA: Diagnosis not present

## 2022-06-14 DIAGNOSIS — Z794 Long term (current) use of insulin: Secondary | ICD-10-CM | POA: Diagnosis not present

## 2022-06-14 DIAGNOSIS — Z7984 Long term (current) use of oral hypoglycemic drugs: Secondary | ICD-10-CM

## 2022-06-14 LAB — POCT GLYCOSYLATED HEMOGLOBIN (HGB A1C): Hemoglobin A1C: 6.3 % — AB (ref 4.0–5.6)

## 2022-06-14 LAB — GLUCOSE, CAPILLARY: Glucose-Capillary: 184 mg/dL — ABNORMAL HIGH (ref 70–99)

## 2022-06-14 NOTE — Patient Instructions (Addendum)
Sierra King you for allowing me to take part in your care today.  Here are your instructions.  1.  I want you to continue making lifestyle modifications.  Your A1c today was 6.3.  This is at goal.  Continue the good work.  We can formally discontinue your insulin today.  Continue with metformin 1000 mg daily and Trulicity 1.5 mg weekly.  Let us know if you need any refills.  2.  I am checking your lipid panel today, I will call you with the results.  3. Please follow-up in 6 months for diabetes follow-up.  Thank you, Dr. Allena Katz  If you have any other questions please contact the internal medicine clinic at [phone]

## 2022-06-14 NOTE — Telephone Encounter (Signed)
Decsion: Approved Sierra King (Key: [id number]) PA Case ID #: [id number] Need Help? Call us at [phone] Outcome Approved today Your PA request has been approved. Additional information will be provided in the approval communication. (Message 1145) Authorization Expiration Date: 06/13/2023 Drug Trulicity 1.5MG /0.5ML pen-injectors ePA cloud logo Form [employer] Electronic PA Form [phone] NCPDP)  Approval has been faxed to the pharmacy.

## 2022-06-14 NOTE — Assessment & Plan Note (Signed)
Lipid panel October 2023, LDL elevated at 113.  Goal will be less than 100.  Patient currently on rosuvastatin 10 mg daily.  Patient reports compliance.  Denies any concerns.  Plan: -Recheck lipid panel today -Continue rosuvastatin 10 mg daily  Addendum: -New lipid panel with LDL at goal. Continue with Rosuvastatin 10.

## 2022-06-14 NOTE — Assessment & Plan Note (Signed)
Patient presents for diabetes follow-up.  Patient states she has been eating well, and exercising.  She states she has been walking about 15 to 30 minutes 5 days a week.  She denies any episodes of hypoglycemia.  She does admit that she has stopped her insulin altogether.  She states that she has not been able to get her 3.0 mg Trulicity, and only has been doing 1.5 mg Trulicity.  Denies any other concerns addressed.  She denies any refills today. Foot exam updated today. [name] wore the CGM for 14 days. The average reading was 140, % time in target was 91, % time below target was 0, and % time above target was 9.  A1c today is 6.3 down from 6.4 4 months ago.  Plan will be to formally discontinue insulin.  Plan: -Discontinue Lantus 12 units daily -Continue metformin 1000 mg daily -Continue Trulicity 1.5 mg weekly -Follow up in 6 months -Obtain lipid panel today

## 2022-06-14 NOTE — Progress Notes (Signed)
CC: Diabetes follow up   HPI:  [name] is a 47 y.o. female with a past medical history of hypertension, type 2 diabetes who presents for follow-up.  See assessment and plan for full HPI   Past Medical History:  Diagnosis Date   Allergy    Anemia    Arthritis    Diabetes mellitus without complication (HCC)    Fibroids    [place] 08/07/2016   Hypertension      Current Outpatient Medications:    amLODipine (NORVASC) 5 MG tablet, Take 1 tablet (5 mg total) by mouth daily., Disp: 90 tablet, Rfl: 3   blood glucose meter kit and supplies KIT, Use up to four times daily as directed., Disp: 1 each, Rfl: 0   clotrimazole-betamethasone (LOTRISONE) cream, Apply 1 Application topically 2 (two) times daily to affected area., Disp: 15 g, Rfl: 1   Continuous Blood Gluc Sensor (DEXCOM G7 SENSOR) MISC, Use to monitor your blood sugar continuously, Disp: 3 each, Rfl: 11   Dulaglutide (TRULICITY) 1.5 MG/0.5ML SOPN, Inject 1.5 mg into the skin once a week., Disp: 2 mL, Rfl: 2   hydrochlorothiazide (HYDRODIURIL) 25 MG tablet, Take 1 tablet (25 mg total) by mouth daily., Disp: 90 tablet, Rfl: 3   ibuprofen (ADVIL) 200 MG tablet, Take 200 mg by mouth every 6 (six) hours as needed for headache or mild pain., Disp: , Rfl:    insulin glargine (LANTUS) 100 unit/mL SOPN, Inject 12 Units into the skin daily., Disp: 15 mL, Rfl: 0   Insulin Pen Needle (PENTIPS) 32G X 4 MM MISC, Use 4 (four) times daily as needed., Disp: 200 each, Rfl: 0   metFORMIN (GLUCOPHAGE) 1000 MG tablet, TAKE 1 TABLET BY MOUTH EVERY DAY WITH BREAKFAST, Disp: 90 tablet, Rfl: 1   rosuvastatin (CRESTOR) 10 MG tablet, Take 1 tablet (10 mg total) by mouth daily., Disp: 30 tablet, Rfl: 11  Review of Systems:   Negative except for what is stated in HPI.  Physical Exam:  Vitals:   06/14/22 0905  BP: 113/73  Pulse: (!) 114  Temp: 98.6 F (37 C)  TempSrc: Oral  SpO2: 98%  Weight: 225 lb 6.4 oz (102.2 kg)  Height: 5\' 1"  (1.549 m)     General: Patient is sitting comfortably in the room  Head: Normocephalic, atraumatic  Cardio: Regular rate and rhythm, no murmurs, rubs or gallops. 2+ pulses to bilateral upper and lower extremities  Pulmonary: Clear to ausculation bilaterally with no rales, rhonchi, and crackles   Extremities: Bilateral lower extremities with trace sensation on dorsal and plantar surfaces.  Patient able to distinguish between sharp and dull stimuli.  No wounds appreciated.  2+ pedal pulses appreciated.  Assessment & Plan:   Diabetes mellitus Sierra King) Patient presents for diabetes follow-up.  Patient states she has been eating well, and exercising.  She states she has been walking about 15 to 30 minutes 5 days a week.  She denies any episodes of hypoglycemia.  She does admit that she has stopped her insulin altogether.  She states that she has not been able to get her 3.0 mg Trulicity, and only has been doing 1.5 mg Trulicity.  Denies any other concerns addressed.  She denies any refills today. Foot exam updated today. Sierra King wore the CGM for 14 days. The average reading was 140, % time in target was 91, % time below target was 0, and % time above target was 9.  A1c today is 6.3 down from 6.4 4  months ago.  Plan will be to formally discontinue insulin.  Plan: -Discontinue Lantus 12 units daily -Continue metformin 1000 mg daily -Continue Trulicity 1.5 mg weekly -Follow up in 6 months -Obtain lipid panel today  Essential hypertension Blood pressure 113/73 today.  Well-controlled on amlodipine 5 mg daily and HCTZ 25 mg daily.  Patient denies any chest pain, vision changes, or shortness of breath.  Plan: -Continue amlodipine 5 mg daily -Continue HCTZ 25 mg -BMP at next visit   HLD (hyperlipidemia) Lipid panel October 2023, LDL elevated at 113.  Goal will be less than 100.  Patient currently on rosuvastatin 10 mg daily.  Patient reports compliance.  Denies any concerns.  Plan: -Recheck lipid  panel today -Continue rosuvastatin 10 mg daily  Patient discussed with Dr. Marquita Palms, DO PGY-1 Internal Medicine Resident  Pager: [phone]

## 2022-06-14 NOTE — Assessment & Plan Note (Addendum)
Blood pressure 113/73 today.  Well-controlled on amlodipine 5 mg daily and HCTZ 25 mg daily.  Patient denies any chest pain, vision changes, or shortness of breath.  Plan: -Continue amlodipine 5 mg daily -Continue HCTZ 25 mg -BMP at next visit

## 2022-06-14 NOTE — Telephone Encounter (Signed)
Prior Authorization for patient (Trulicity) came through on cover my meds was submitted awaiting approval or denial

## 2022-06-15 LAB — LIPID PANEL
Chol/HDL Ratio: 3.1 ratio (ref 0.0–4.4)
Cholesterol, Total: 157 mg/dL (ref 100–199)
HDL: 51 mg/dL (ref >39)
LDL Chol Calc (NIH): 91 mg/dL (ref 0–99)
Triglycerides: 77 mg/dL (ref 0–149)
VLDL Cholesterol Cal: 15 mg/dL (ref 5–40)

## 2022-06-15 NOTE — Progress Notes (Signed)
Internal Medicine Clinic Attending  Case discussed with Dr. Patel  At the time of the visit.  We reviewed the resident's history and exam and pertinent patient test results.  I agree with the assessment, diagnosis, and plan of care documented in the resident's note.  

## 2022-06-27 ENCOUNTER — Other Ambulatory Visit (HOSPITAL_COMMUNITY): Payer: Self-pay

## 2022-06-28 ENCOUNTER — Encounter: Payer: Self-pay | Admitting: Dietician

## 2022-09-01 ENCOUNTER — Other Ambulatory Visit: Payer: Self-pay | Admitting: Internal Medicine

## 2022-09-01 NOTE — Telephone Encounter (Signed)
Next appt scheduled 11/15 with Dr Nooruddin.

## 2022-09-06 DIAGNOSIS — I10 Essential (primary) hypertension: Secondary | ICD-10-CM | POA: Diagnosis not present

## 2022-09-06 DIAGNOSIS — R7303 Prediabetes: Secondary | ICD-10-CM | POA: Diagnosis not present

## 2022-09-06 DIAGNOSIS — Z01419 Encounter for gynecological examination (general) (routine) without abnormal findings: Secondary | ICD-10-CM | POA: Diagnosis not present

## 2022-09-25 DIAGNOSIS — Z1231 Encounter for screening mammogram for malignant neoplasm of breast: Secondary | ICD-10-CM | POA: Diagnosis not present

## 2022-10-16 IMAGING — DX DG SHOULDER 2+V*R*
3 series · 3 of 3 positions shown · non-contrast
Comparison: None Available.

CLINICAL DATA: Right shoulder pain for a few months.

EXAM:
RIGHT SHOULDER - 2+ VIEW

[shoulder grashey]
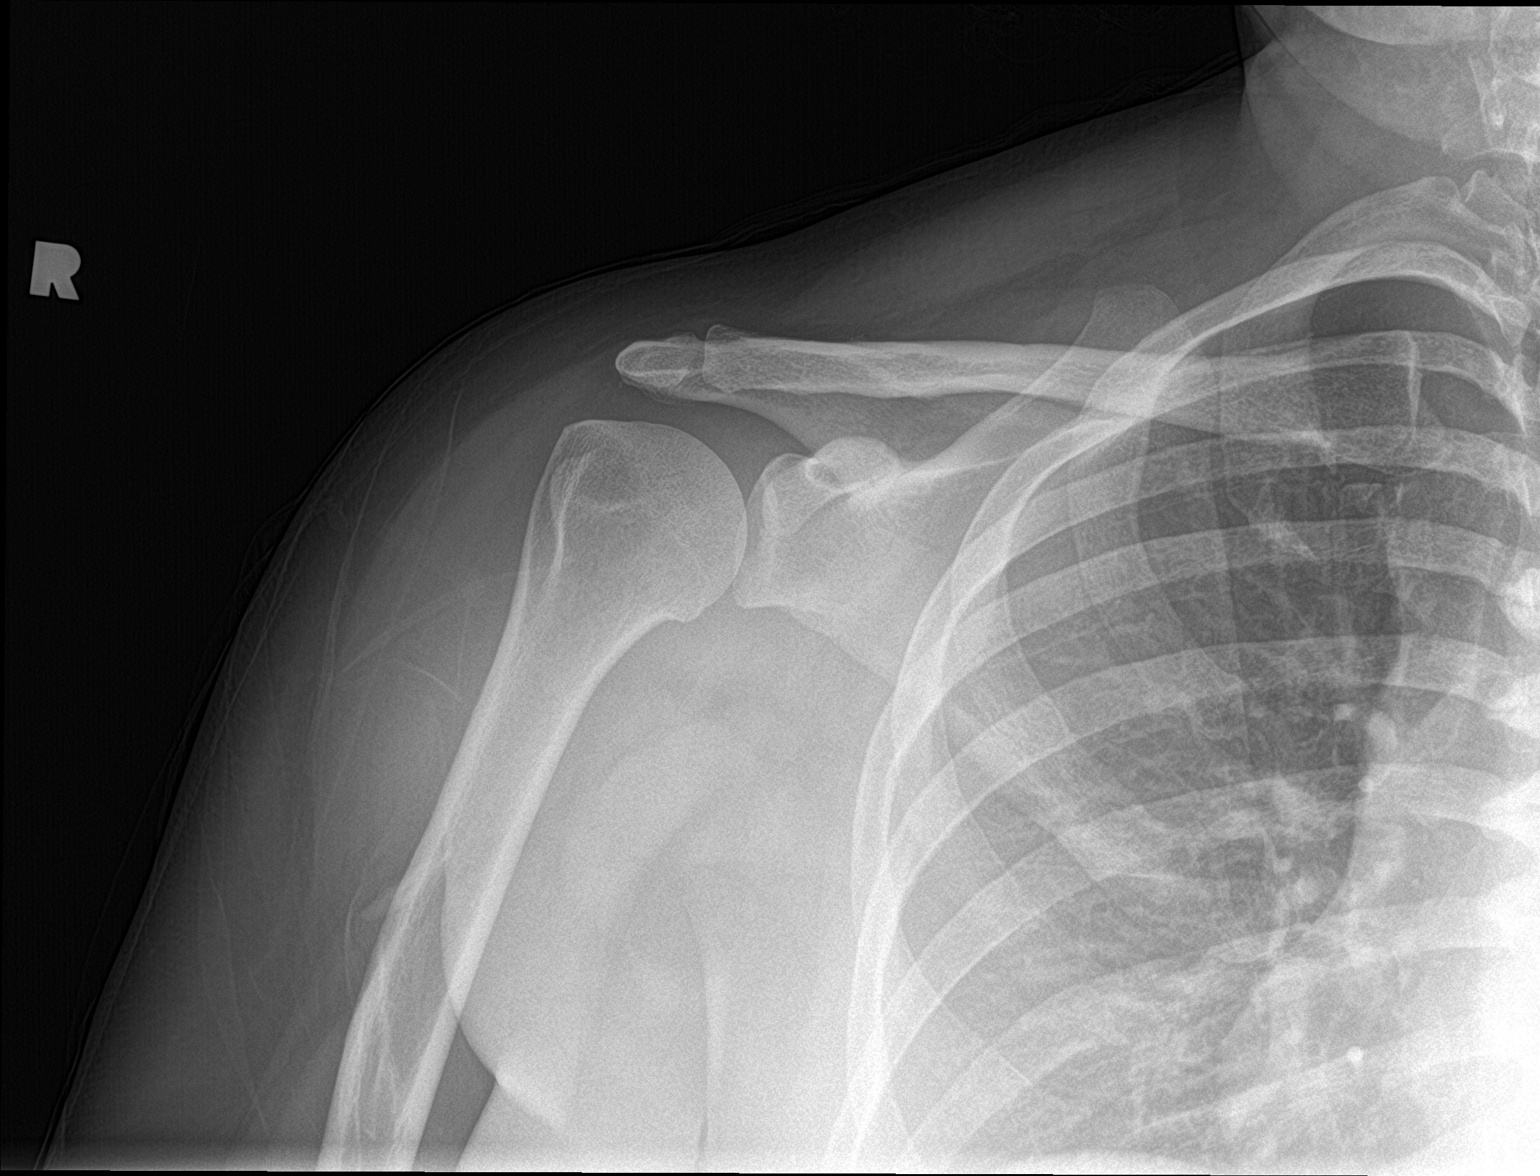

[shoulder y view]
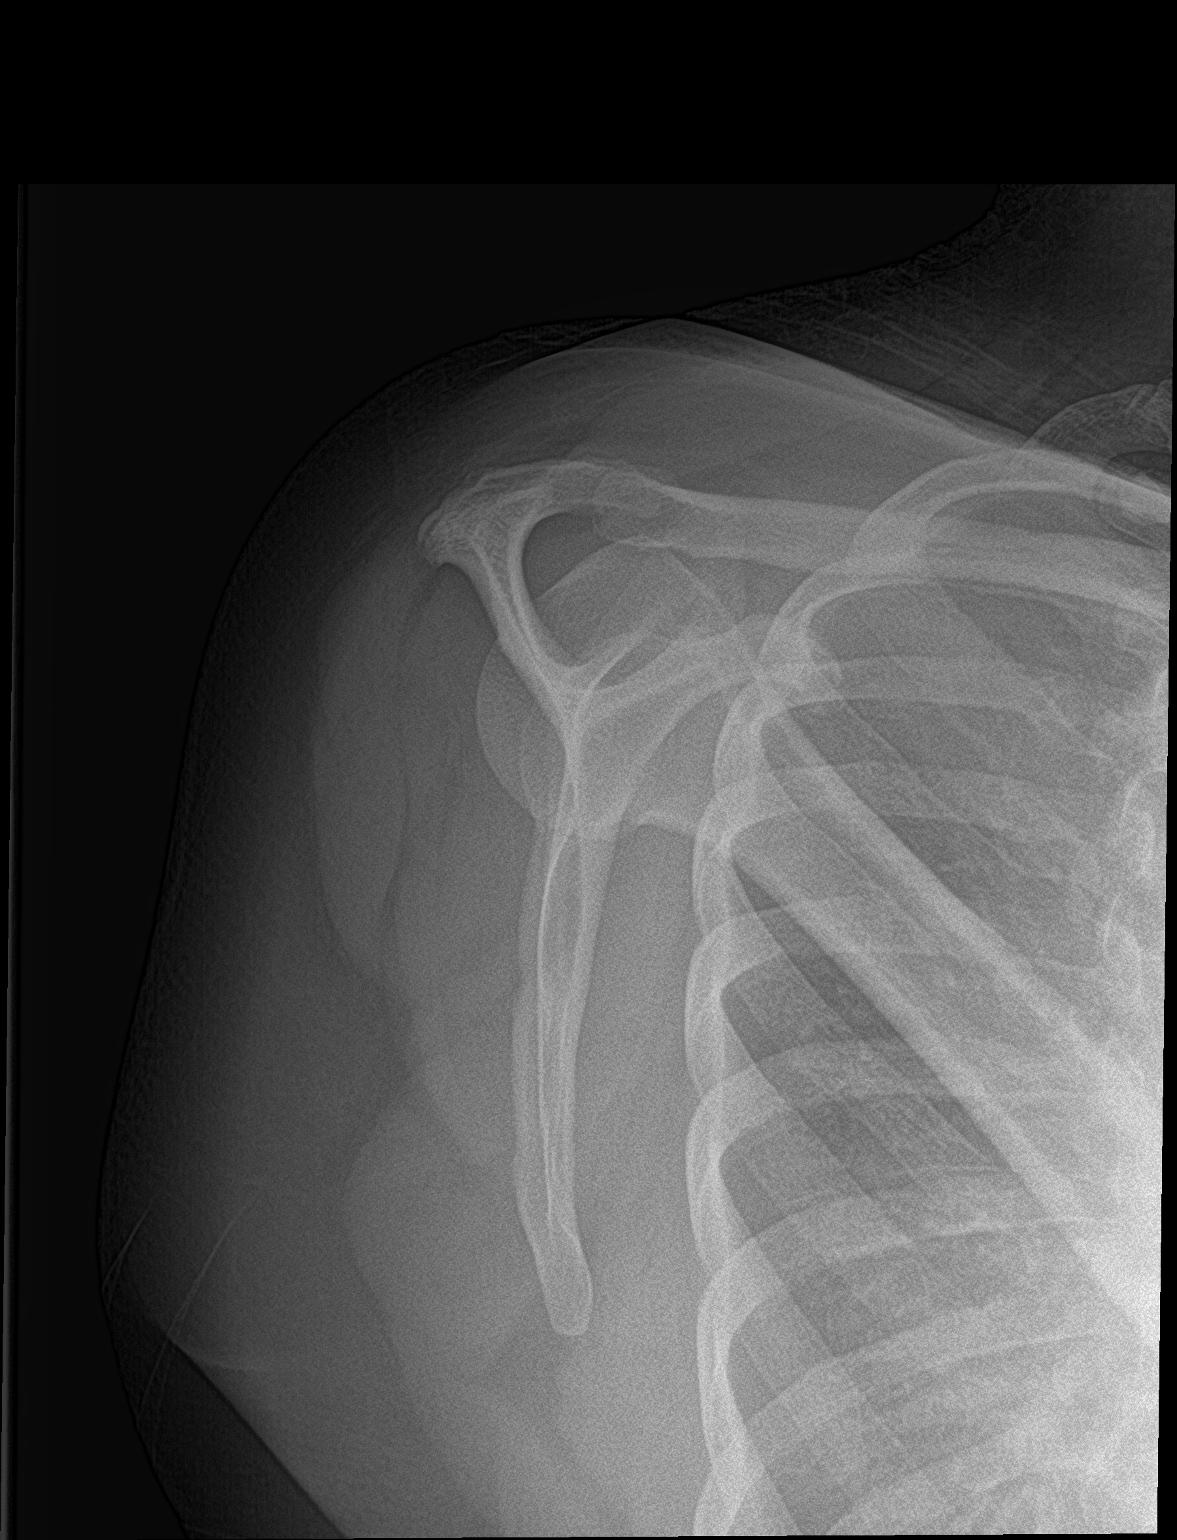

[shoulder axillary]
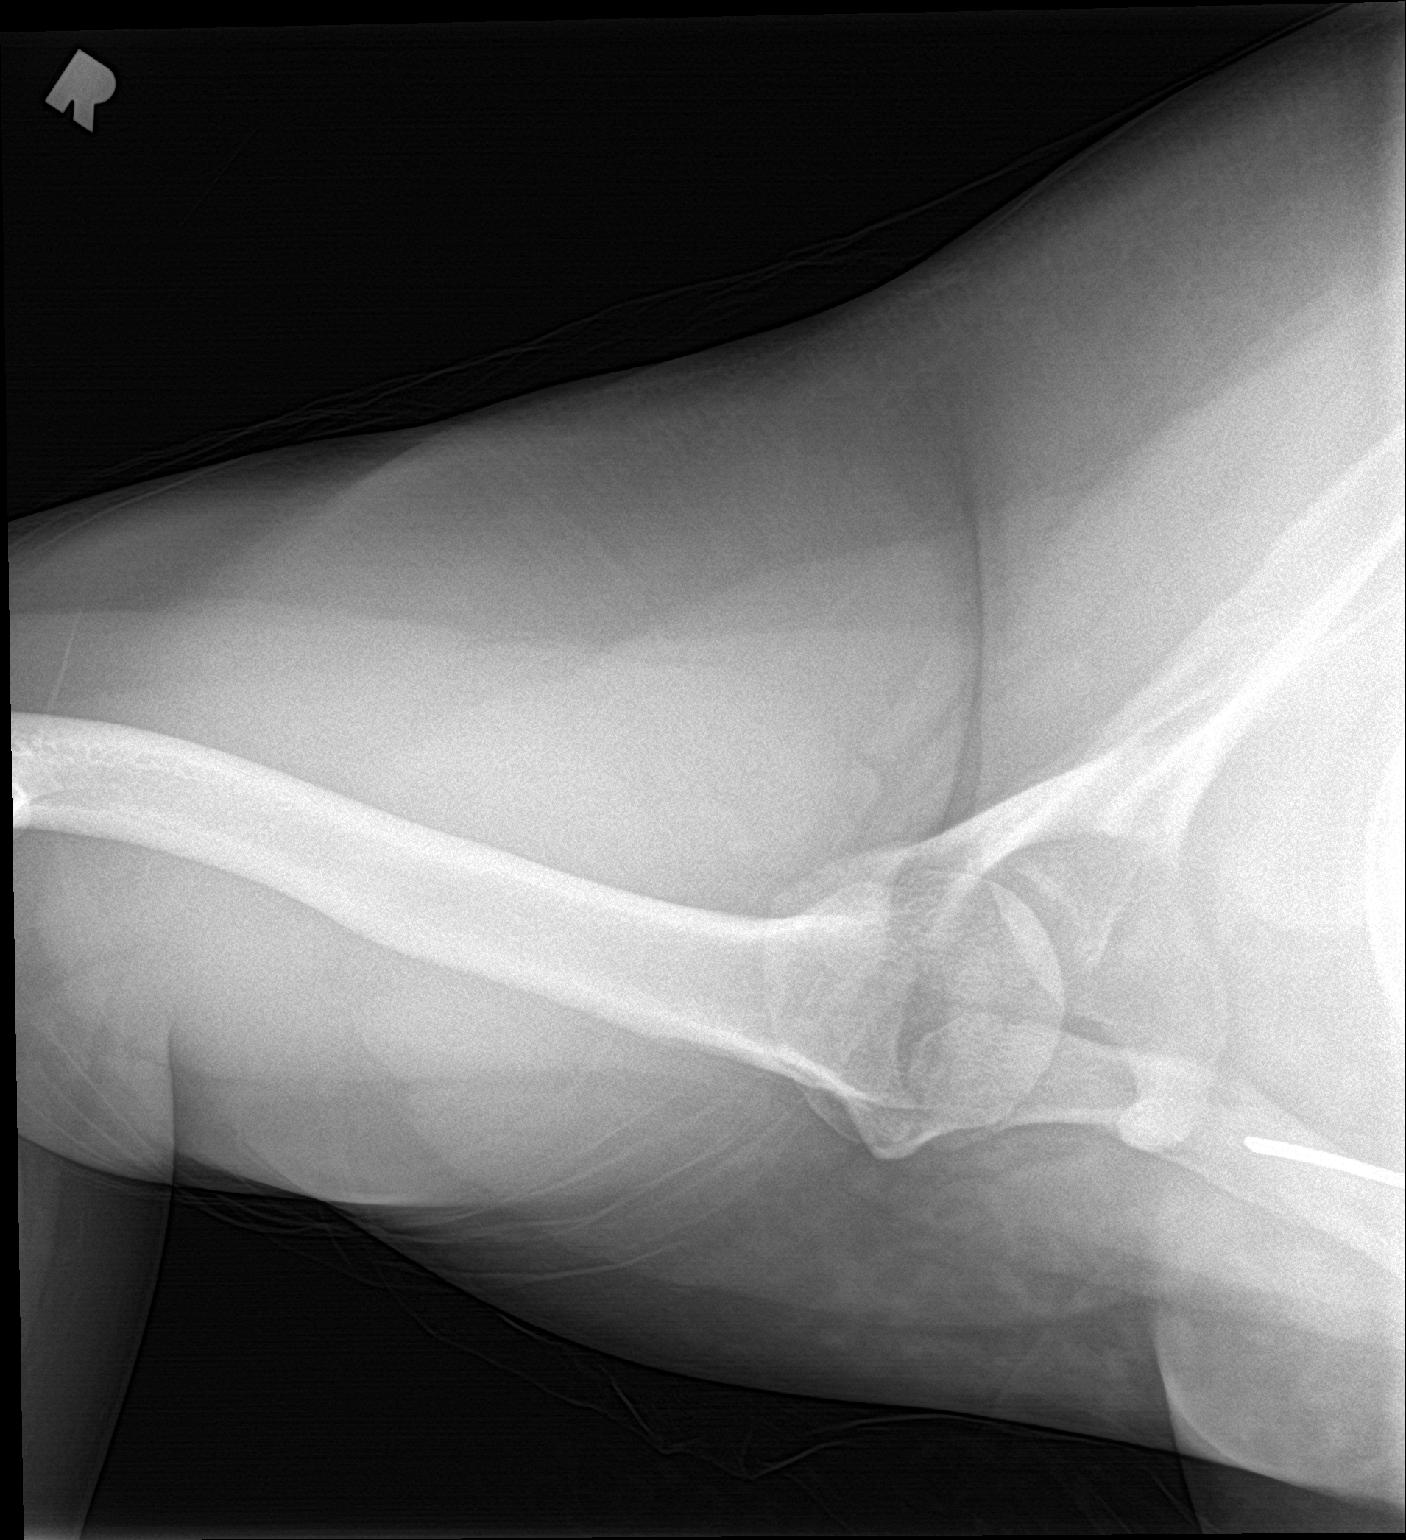

[3 of 3 positions shown; findings below may reference images not displayed]

FINDINGS: Minimal acromioclavicular joint space narrowing and peripheral
osteophytosis. No significant glenohumeral osteoarthritis. No acute
fracture or dislocation. The visualized portion of the right lung is
unremarkable.
IMPRESSION: Minimal acromioclavicular osteoarthritis.

## 2022-12-15 ENCOUNTER — Encounter: Payer: 59 | Admitting: Student

## 2023-01-04 ENCOUNTER — Ambulatory Visit (INDEPENDENT_AMBULATORY_CARE_PROVIDER_SITE_OTHER): Payer: 59 | Admitting: Student

## 2023-01-04 ENCOUNTER — Encounter: Payer: Self-pay | Admitting: Student

## 2023-01-04 VITALS — BP 150/87 | HR 98 | Temp 98.9°F | Ht 61.0 in | Wt 227.2 lb

## 2023-01-04 DIAGNOSIS — E139 Other specified diabetes mellitus without complications: Secondary | ICD-10-CM

## 2023-01-04 DIAGNOSIS — Z0000 Encounter for general adult medical examination without abnormal findings: Secondary | ICD-10-CM

## 2023-01-04 DIAGNOSIS — E119 Type 2 diabetes mellitus without complications: Secondary | ICD-10-CM

## 2023-01-04 DIAGNOSIS — I10 Essential (primary) hypertension: Secondary | ICD-10-CM

## 2023-01-04 DIAGNOSIS — Z7984 Long term (current) use of oral hypoglycemic drugs: Secondary | ICD-10-CM | POA: Diagnosis not present

## 2023-01-04 DIAGNOSIS — E785 Hyperlipidemia, unspecified: Secondary | ICD-10-CM | POA: Diagnosis not present

## 2023-01-04 LAB — POCT GLYCOSYLATED HEMOGLOBIN (HGB A1C): Hemoglobin A1C: 6.9 % — AB (ref 4.0–5.6)

## 2023-01-04 LAB — GLUCOSE, CAPILLARY: Glucose-Capillary: 161 mg/dL — ABNORMAL HIGH (ref 70–99)

## 2023-01-04 NOTE — Patient Instructions (Signed)
You are doing well!  A1c is 6.9. Keep up with diet and exercise to try and keep it below 7.  Remember to take your daily blood pressure medicines. It is okay to miss a day here and there, but regular doses is best.  Consider a calorie/food tracking app to get the best sense of everything you are eating. If able, increasing the duration and intensity of your exercise will always help.  Please return in 3 months.  Best, Dr. Ninfa Meeker

## 2023-01-04 NOTE — Assessment & Plan Note (Addendum)
150/87 is not goal however she has not taken her blood pressure today or yesterday because she has been busy.  Did reemphasized daily adherence, she is usually pretty good about it.  Will not change regimen today, need to ensure it is under control at next visit or escalation of therapy may be warranted. - Continue hydrochlorothiazide 20 daily and amlodipine 5 daily - BMP today, annual

## 2023-01-04 NOTE — Progress Notes (Addendum)
CC: Follow up Diabetes HTN  HPI:  Sierra King is a 47 y.o. female living with a history stated below and presents today for followup. She has no acute complaints. Please see problem based assessment and plan for additional details.  Past Medical History:  Diagnosis Date   Allergy    Anemia    Arthritis    Diabetes mellitus without complication (HCC)    Fibroids    [place] 08/07/2016   Hypertension     Current Outpatient Medications on File Prior to Visit  Medication Sig Dispense Refill   amLODipine (NORVASC) 5 MG tablet Take 1 tablet (5 mg total) by mouth daily. 90 tablet 3   blood glucose meter kit and supplies KIT Use up to four times daily as directed. 1 each 0   Continuous Blood Gluc Sensor (DEXCOM G7 SENSOR) MISC Use to monitor your blood sugar continuously 3 each 11   hydrochlorothiazide (HYDRODIURIL) 25 MG tablet Take 1 tablet (25 mg total) by mouth daily. 90 tablet 3   ibuprofen (ADVIL) 200 MG tablet Take 200 mg by mouth every 6 (six) hours as needed for headache or mild pain.     Insulin Pen Needle (PENTIPS) 32G X 4 MM MISC Use 4 (four) times daily as needed. 200 each 0   metFORMIN (GLUCOPHAGE) 1000 MG tablet TAKE 1 TABLET BY MOUTH EVERY DAY WITH BREAKFAST 30 tablet 5   rosuvastatin (CRESTOR) 10 MG tablet Take 1 tablet (10 mg total) by mouth daily. 30 tablet 11   No current facility-administered medications on file prior to visit.    Family History  Problem Relation Age of Onset   Diabetes Mother    Hypertension Mother    Diabetes Father    Hypertension Father    Colon cancer Neg Hx    Colon polyps Neg Hx    Esophageal cancer Neg Hx    Rectal cancer Neg Hx    Stomach cancer Neg Hx     Social History   Socioeconomic History   Marital status: Married    Spouse name: Not on file   Number of children: Not on file   Years of education: Not on file   Highest education level: Not on file  Occupational History   Not on file  Tobacco Use   Smoking status:  Never   Smokeless tobacco: Never  Vaping Use   Vaping status: Never Used  Substance and Sexual Activity   Alcohol use: Yes    Comment: occasional glass of wine or beer   Drug use: Yes    Types: Marijuana    Comment: OCASSIONAL- "been a while"   Sexual activity: Yes    Partners: Male    Comment: husband has vasectomy  Other Topics Concern   Not on file  Social History Narrative   Not on file   Social Determinants of Health   Financial Resource Strain: Low Risk  (12/27/2021)   Overall Financial Resource Strain (CARDIA)    Difficulty of Paying Living Expenses: Not very hard  Food Insecurity: No Food Insecurity (02/06/2022)   Hunger Vital Sign    Worried About Running Out of Food in the Last Year: Never true    Ran Out of Food in the Last Year: Never true  Transportation Needs: No Transportation Needs (02/06/2022)   PRAPARE - Administrator, Civil Service (Medical): No    Lack of Transportation (Non-Medical): No  Physical Activity: Insufficiently Active (12/27/2021)   Exercise Vital Sign  Days of Exercise per Week: 7 days    Minutes of Exercise per Session: 20 min  Stress: Stress Concern Present (12/27/2021)   [employer] of Occupational Health - Occupational Stress Questionnaire    Feeling of Stress : To some extent  Social Connections: Moderately Integrated (02/06/2022)   Social Connection and Isolation Panel [NHANES]    Frequency of Communication with Friends and Family: More than three times a week    Frequency of Social Gatherings with Friends and Family: More than three times a week    Attends Religious Services: More than 4 times per year    Active Member of [employer] or Organizations: No    Attends Banker Meetings: Never    Marital Status: Married  Catering manager Violence: Not At Risk (02/06/2022)   Humiliation, Afraid, Rape, and Kick questionnaire    Fear of Current or Ex-Partner: No    Emotionally Abused: No    Physically Abused: No     Sexually Abused: No    Review of Systems: ROS negative except for what is noted on the assessment and plan.  Vitals:   01/04/23 1540 01/04/23 1608  BP: (!) 153/91 (!) 150/87  Pulse: 99 98  Temp: 98.9 F (37.2 C)   TempSrc: Oral   SpO2: 99%   Weight: 227 lb 3.2 oz (103.1 kg)   Height: 5\' 1"  (1.549 m)     Physical Exam: Constitutional: well-appearing female sitting in chair, in no acute distress HENT: normocephalic atraumatic, mucous membranes moist Eyes: conjunctiva non-erythematous Cardiovascular: regular rate and rhythm, no m/r/g Pulmonary/Chest: normal work of breathing on room air, lungs clear to auscultation bilaterally MSK: normal bulk and tone.  Diabetic Feet: Feet without sensory deficits, lesions, good pulses. Neurological: alert & oriented x 3, no focal deficit Skin: warm and dry Psych: normal mood and behavior  Assessment & Plan:   Patient seen with Dr. Oswaldo Done  Diabetes mellitus (HCC) Today A1c is 6.9.  This is an increase from 6.3 six months ago.  On metformin 1000 daily only.  A1c was 13 a year ago.  Has since been treated and stopped Lantus and Trulicity (Trulicity was expensive even with insurance).  CGM is no longer approved by insurance either, but she does not need it and she is okay with that.  In the future, could attempt to restart GLP-1 which had helped her, and the increase in her A1c might suggest that there remains a place for that medicine in the future, however she is currently at goal below 7 so we will only do metformin for now.  Discussed exercise and diet, she walks about 20 minutes daily, tends to eat homemade foods, no sugary drinks, rare dessert, no snack food.  I advised that she gets a calorie tracking app to see hidden sources of unhealthy foods, she does have further weight loss goals. - Continue metformin 1000 mg daily.  Return in 3 months. - ACR today, foot exam normal  Essential hypertension 150/87 is not goal however she has not taken  her blood pressure today or yesterday because she has been busy.  Did reemphasized daily adherence, she is usually pretty good about it.  Will not change regimen today, need to ensure it is under control at next visit or escalation of therapy may be warranted. - Continue hydrochlorothiazide 20 daily and amlodipine 5 daily - BMP today, annual  HLD (hyperlipidemia) Crestor 10 daily.  LDL is below 100 when checked this summer.  Healthcare maintenance She does  Pap smears at gynecology.  Most recent was this summer.  Katheran James, D.O. [facility] Internal Medicine, PGY-1 Phone: [phone] Date 01/05/2023 Time 9:17 AM

## 2023-01-04 NOTE — Assessment & Plan Note (Signed)
She does Pap smears at gynecology.  Most recent was this summer.

## 2023-01-04 NOTE — Assessment & Plan Note (Signed)
Crestor 10 daily.  LDL is below 100 when checked this summer.

## 2023-01-04 NOTE — Assessment & Plan Note (Addendum)
Today A1c is 6.9.  This is an increase from 6.3 six months ago.  On metformin 1000 daily only.  A1c was 13 a year ago.  Has since been treated and stopped Lantus and Trulicity (Trulicity was expensive even with insurance).  CGM is no longer approved by insurance either, but she does not need it and she is okay with that.  In the future, could attempt to restart GLP-1 which had helped her, and the increase in her A1c might suggest that there remains a place for that medicine in the future, however she is currently at goal below 7 so we will only do metformin for now.  Discussed exercise and diet, she walks about 20 minutes daily, tends to eat homemade foods, no sugary drinks, rare dessert, no snack food.  I advised that she gets a calorie tracking app to see hidden sources of unhealthy foods, she does have further weight loss goals. - Continue metformin 1000 mg daily.  Return in 3 months. - ACR today, foot exam normal

## 2023-01-05 LAB — BASIC METABOLIC PANEL
BUN/Creatinine Ratio: 12 (ref 9–23)
BUN: 8 mg/dL (ref 6–24)
CO2: 20 mmol/L (ref 20–29)
Calcium: 9.9 mg/dL (ref 8.7–10.2)
Chloride: 103 mmol/L (ref 96–106)
Creatinine, Ser: 0.66 mg/dL (ref 0.57–1.00)
Glucose: 125 mg/dL — ABNORMAL HIGH (ref 70–99)
Potassium: 4 mmol/L (ref 3.5–5.2)
Sodium: 140 mmol/L (ref 134–144)
eGFR: 109 mL/min/{1.73_m2} (ref >59)

## 2023-01-05 LAB — MICROALBUMIN / CREATININE URINE RATIO
Creatinine, Urine: 143.1 mg/dL
Microalb/Creat Ratio: 9 mg/g{creat} (ref 0–29)
Microalbumin, Urine: 12.9 ug/mL

## 2023-01-05 NOTE — Addendum Note (Signed)
Addended by: Katheran James on: 01/05/2023 09:19 AM   Modules accepted: Level of Service

## 2023-01-05 NOTE — Progress Notes (Signed)
Internal Medicine Clinic Attending  I was physically present during the key portions of the resident provided service and participated in the medical decision making of patient's management care. I reviewed pertinent patient test results.  The assessment, diagnosis, and plan were formulated together and I agree with the documentation in the resident's note.  Erlinda Hong, MD FACP

## 2023-01-29 ENCOUNTER — Other Ambulatory Visit: Payer: Self-pay

## 2023-01-29 ENCOUNTER — Other Ambulatory Visit (HOSPITAL_COMMUNITY): Payer: Self-pay

## 2023-01-29 MED ORDER — AMLODIPINE BESYLATE 5 MG PO TABS
5.0000 mg | ORAL_TABLET | Freq: Every day | ORAL | 3 refills | Status: DC
  Filled 2023-01-29: qty 30, 30d supply, fill #0

## 2023-01-29 NOTE — Telephone Encounter (Signed)
Medication sent to pharmacy  

## 2023-02-02 ENCOUNTER — Other Ambulatory Visit: Payer: Self-pay

## 2023-02-02 MED ORDER — HYDROCHLOROTHIAZIDE 25 MG PO TABS: 25.0000 mg | ORAL_TABLET | Freq: Every day | ORAL | 3 refills | Status: DC

## 2023-02-02 NOTE — Telephone Encounter (Signed)
 Medication sent to pharmacy

## 2023-02-08 ENCOUNTER — Other Ambulatory Visit (HOSPITAL_COMMUNITY): Payer: Self-pay

## 2023-04-23 ENCOUNTER — Encounter: Admitting: Student

## 2023-07-30 ENCOUNTER — Telehealth: Payer: Self-pay

## 2023-07-30 NOTE — Telephone Encounter (Signed)
 I called pt back regarding appt, no answer. I left message to call the clinic back.

## 2023-07-30 NOTE — Telephone Encounter (Signed)
 Copied from CRM [id number]. Topic: Appointments - Scheduling Inquiry for Clinic >> Jul 30, 2023  8:40 AM Graeme ORN wrote: Reason for CRM: Patient called. Had appt for 7/3 needed to reschedule. Was only able to schedule the office visit - it does not save as open established, just office visit. Also I was not able to schedule eye exam. No dates available. Thank You

## 2023-08-01 ENCOUNTER — Ambulatory Visit: Admitting: Dietician

## 2023-08-02 ENCOUNTER — Ambulatory Visit: Admitting: Dietician

## 2023-08-02 ENCOUNTER — Other Ambulatory Visit: Payer: Self-pay | Admitting: Dietician

## 2023-08-02 ENCOUNTER — Encounter: Admitting: Student

## 2023-08-02 DIAGNOSIS — E139 Other specified diabetes mellitus without complications: Secondary | ICD-10-CM

## 2023-08-02 NOTE — Progress Notes (Signed)
 Signed referral request discussed with RD Plyler

## 2023-08-02 NOTE — Progress Notes (Signed)
 Referral request

## 2023-08-06 ENCOUNTER — Ambulatory Visit: Admitting: Dietician

## 2023-09-21 DIAGNOSIS — E119 Type 2 diabetes mellitus without complications: Secondary | ICD-10-CM | POA: Diagnosis not present

## 2023-09-21 DIAGNOSIS — H04123 Dry eye syndrome of bilateral lacrimal glands: Secondary | ICD-10-CM | POA: Diagnosis not present

## 2023-09-21 DIAGNOSIS — H02535 Eyelid retraction left lower eyelid: Secondary | ICD-10-CM | POA: Diagnosis not present

## 2023-09-21 DIAGNOSIS — H02532 Eyelid retraction right lower eyelid: Secondary | ICD-10-CM | POA: Diagnosis not present

## 2023-09-21 LAB — HM DIABETES EYE EXAM

## 2023-09-24 DIAGNOSIS — Z01419 Encounter for gynecological examination (general) (routine) without abnormal findings: Secondary | ICD-10-CM | POA: Diagnosis not present

## 2023-09-24 DIAGNOSIS — I10 Essential (primary) hypertension: Secondary | ICD-10-CM | POA: Diagnosis not present

## 2023-09-24 DIAGNOSIS — E119 Type 2 diabetes mellitus without complications: Secondary | ICD-10-CM | POA: Diagnosis not present

## 2023-09-27 ENCOUNTER — Encounter: Payer: Self-pay | Admitting: Emergency Medicine

## 2023-09-27 ENCOUNTER — Ambulatory Visit: Admission: EM | Admit: 2023-09-27 | Discharge: 2023-09-27 | Disposition: A | Discharge status: Home / Self Care

## 2023-09-27 DIAGNOSIS — R202 Paresthesia of skin: Secondary | ICD-10-CM
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
## 2023-10-02 ENCOUNTER — Encounter: Payer: Self-pay | Admitting: Sports Medicine

## 2023-10-02 DIAGNOSIS — Z1231 Encounter for screening mammogram for malignant neoplasm of breast: Secondary | ICD-10-CM | POA: Diagnosis not present

## 2023-11-20 ENCOUNTER — Encounter (INDEPENDENT_AMBULATORY_CARE_PROVIDER_SITE_OTHER): Admitting: Primary Care

## 2023-12-17 ENCOUNTER — Ambulatory Visit (INDEPENDENT_AMBULATORY_CARE_PROVIDER_SITE_OTHER): Admitting: Primary Care

## 2023-12-17 ENCOUNTER — Encounter (INDEPENDENT_AMBULATORY_CARE_PROVIDER_SITE_OTHER): Payer: Self-pay | Admitting: Primary Care

## 2023-12-17 VITALS — BP 172/103 | HR 88 | Resp 16 | Ht 60.5 in | Wt 215.6 lb

## 2023-12-17 DIAGNOSIS — Z7689 Persons encountering health services in other specified circumstances: Secondary | ICD-10-CM

## 2023-12-17 DIAGNOSIS — E139 Other specified diabetes mellitus without complications: Secondary | ICD-10-CM

## 2023-12-17 DIAGNOSIS — E782 Mixed hyperlipidemia: Secondary | ICD-10-CM

## 2023-12-17 DIAGNOSIS — I10 Essential (primary) hypertension: Secondary | ICD-10-CM | POA: Diagnosis not present

## 2023-12-17 DIAGNOSIS — Z1231 Encounter for screening mammogram for malignant neoplasm of breast: Secondary | ICD-10-CM

## 2023-12-17 DIAGNOSIS — Z6841 Body Mass Index (BMI) 40.0 and over, adult: Secondary | ICD-10-CM | POA: Diagnosis not present

## 2023-12-20 MED ORDER — VALSARTAN-HYDROCHLOROTHIAZIDE 160-25 MG PO TABS
1.0000 | ORAL_TABLET | Freq: Every day | ORAL | 1 refills | Status: AC
  Filled 2023-12-20: qty 30, 30d supply, fill #0

## 2023-12-20 MED ORDER — AMLODIPINE BESYLATE 10 MG PO TABS
10.0000 mg | ORAL_TABLET | Freq: Every day | ORAL | 1 refills | Status: AC
  Filled 2023-12-20: qty 30, 30d supply, fill #0

## 2023-12-20 NOTE — Progress Notes (Signed)
 New Patient Office Visit  Subjective    Patient ID: Sierra King female  DOB: 1975-08-27  Age: 48 y.o. MRN: [id number]   CC:  Establish care  HPI     New Patient (Initial Visit)    Additional comments: Establish care   Pt states Sierra King has been having some feeling/pain in the breast bone area under and radiates to shoulder and back       Last edited by Casimir Juvenal SAUNDERS, RMA on 12/17/2023  1:48 PM.      HPI  [name] is a 48 year old morbid obese female establishing care.  Sierra King has a medical history of Diabetes mellitus of other type without complication, unspecified whether long term insulin  use.  Sierra King voices concerns of pain under her breast and back thoracic area-Sierra King tried to demonstrated proper body mechanics when asked to lift something up.  However, used her knees and back.  Sierra King denies any falls or trauma Essential hypertension-elevated on this visit.-Patient has No headache, No chest pain, No abdominal pain - No Nausea, No new weakness tingling or numbness, No Cough - shortness of breath and Hyperlipidemia.   No current outpatient medications on file prior to visit.   No current facility-administered medications on file prior to visit.     Allergies  Allergen Reactions   Latex Itching    Past Medical History:  Diagnosis Date   Allergy    Anemia    Arthritis    Diabetes mellitus without complication (HCC)    Fibroids    [place] 08/07/2016   Hypertension      Past Surgical History:  Procedure Laterality Date   BREAST LUMPECTOMY Right    CESAREAN SECTION     IR ANGIOGRAM PELVIS SELECTIVE OR SUPRASELECTIVE  08/07/2016   IR ANGIOGRAM PELVIS SELECTIVE OR SUPRASELECTIVE  08/07/2016   IR ANGIOGRAM SELECTIVE EACH ADDITIONAL VESSEL  08/07/2016   IR ANGIOGRAM SELECTIVE EACH ADDITIONAL VESSEL  08/07/2016   IR EMBO TUMOR ORGAN ISCHEMIA INFARCT INC GUIDE ROADMAPPING  08/07/2016   IR RADIOLOGIST EVAL & MGMT  05/16/2016   IR RADIOLOGIST EVAL & MGMT  09/12/2016   IR  RADIOLOGIST EVAL & MGMT  02/27/2017   IR US  GUIDE VASC ACCESS LEFT  08/07/2016   IR US  GUIDE VASC ACCESS RIGHT  08/07/2016   UTERINE ARTERY EMBOLIZATION  08/07/2016   WISDOM TOOTH EXTRACTION       Family History  Problem Relation Age of Onset   Diabetes Mother    Hypertension Mother    Diabetes Father    Hypertension Father    Colon cancer Neg Hx    Colon polyps Neg Hx    Esophageal cancer Neg Hx    Rectal cancer Neg Hx    Stomach cancer Neg Hx     Social History   Socioeconomic History   Marital status: Married    Spouse name: Not on file   Number of children: Not on file   Years of education: Not on file   Highest education level: Not on file  Occupational History   Not on file  Tobacco Use   Smoking status: Never    Passive exposure: Never   Smokeless tobacco: Never  Vaping Use   Vaping status: Never Used  Substance and Sexual Activity   Alcohol use: Yes    Comment: occasional glass of wine or beer   Drug use: Yes    Types: Marijuana    Comment: OCASSIONAL- been a while   Sexual activity:  Yes    Partners: Male    Comment: husband has vasectomy  Other Topics Concern   Not on file  Social History Narrative   Not on file   Social Drivers of Health   Financial Resource Strain: Low Risk  (12/27/2021)   Overall Financial Resource Strain (CARDIA)    Difficulty of Paying Living Expenses: Not very hard  Food Insecurity: No Food Insecurity (02/06/2022)   Hunger Vital Sign    Worried About Running Out of Food in the Last Year: Never true    Ran Out of Food in the Last Year: Never true  Transportation Needs: No Transportation Needs (02/06/2022)   PRAPARE - Administrator, Civil Service (Medical): No    Lack of Transportation (Non-Medical): No  Physical Activity: Insufficiently Active (12/27/2021)   Exercise Vital Sign    Days of Exercise per Week: 7 days    Minutes of Exercise per Session: 20 min  Stress: Stress Concern Present (12/27/2021)   [employer] - Occupational Stress Questionnaire    Feeling of Stress : To some extent  Social Connections: Moderately Integrated (02/06/2022)   Social Connection and Isolation Panel    Frequency of Communication with Friends and Family: More than three times a week    Frequency of Social Gatherings with Friends and Family: More than three times a week    Attends Religious Services: More than 4 times per year    Active Member of Clubs or Organizations: No    Attends Banker Meetings: Never    Marital Status: Married  Catering Manager Violence: Not At Risk (02/06/2022)   Humiliation, Afraid, Rape, and Kick questionnaire    Fear of Current or Ex-Partner: No    Emotionally Abused: No    Physically Abused: No    Sexually Abused: No   Health Maintenance  Topic Date Due   Pneumococcal Vaccine (1 of 2 - PCV) Never done   Hepatitis B Vaccine (1 of 3 - 19+ 3-dose series) Never done   Breast Cancer Screening  Never done   Hemoglobin A1C  04/04/2023   Flu Shot  Never done   COVID-19 Vaccine (1 - 2025-26 season) Never done   Yearly kidney function blood test for diabetes  01/04/2024   Yearly kidney health urinalysis for diabetes  01/04/2024   Pap with HPV screening  01/04/2024*   Complete foot exam   01/04/2024   Eye exam for diabetics  09/20/2024   DTaP/Tdap/Td vaccine (2 - Td or Tdap) 11/22/2031   Colon Cancer Screening  02/29/2032   Hepatitis C Screening  Completed   HIV Screening  Completed   HPV Vaccine  Aged Out   Meningitis B Vaccine  Aged Out  *Topic was postponed. The date shown is not the original due date.    Objective    BP (!) 172/103 (BP Location: Left Arm, Patient Position: Sitting, Cuff Size: Large)   Pulse 88   Resp 16   Ht 5' 0.5 (1.537 m)   Wt 215 lb 9.6 oz (97.8 kg)   SpO2 98%   BMI 41.41 kg/m  BP Readings from Last 3 Encounters:  12/17/23 (!) 172/103  09/27/23 124/84  01/04/23 (!) 150/87       Physical Exam Vitals  reviewed.  Constitutional:      Appearance: Normal appearance. Sierra King is obese.  HENT:     Head: Normocephalic.     Right Ear: Tympanic membrane, ear canal and external ear  normal.     Left Ear: Tympanic membrane, ear canal and external ear normal.     Nose: Nose normal.     Mouth/Throat:     Mouth: Mucous membranes are moist.  Eyes:     Extraocular Movements: Extraocular movements intact.     Pupils: Pupils are equal, round, and reactive to light.  Cardiovascular:     Rate and Rhythm: Normal rate.  Pulmonary:     Effort: Pulmonary effort is normal.     Breath sounds: Normal breath sounds.  Abdominal:     General: Bowel sounds are normal.     Palpations: Abdomen is soft.  Musculoskeletal:        General: Normal range of motion.     Cervical back: Normal range of motion.  Skin:    General: Skin is warm and dry.  Neurological:     Mental Status: Sierra King is alert and oriented to person, place, and time.  Psychiatric:        Mood and Affect: Mood normal.        Behavior: Behavior normal.        Thought Content: Thought content normal.      Assessment & Plan:  Sierra King was seen today for new patient (initial visit).  Diagnoses and all orders for this visit:  Essential hypertension BP goal - < 130/80 Explained that having normal blood pressure is the goal and medications are helping to get to goal and maintain normal blood pressure. DIET: Limit salt intake, read nutrition labels to check salt content, limit fried and high fatty foods  Avoid using multisymptom OTC cold preparations that generally contain sudafed which can rise BP. Consult with pharmacist on best cold relief products to use for persons with HTN EXERCISE Discussed incorporating exercise such as walking - 30 minutes most days of the week and can do in 10 minute intervals    -     amLODipine  (NORVASC ) 10 MG tablet; Take 1 tablet (10 mg total) by mouth daily. -     valsartan-hydrochlorothiazide  (DIOVAN-HCT) 160-25 MG  tablet; Take 1 tablet by mouth daily. -     CMP14+EGFR; Future  Encounter for screening mammogram for malignant neoplasm of breast -     MM 3D SCREENING MAMMOGRAM BILATERAL BREAST; Future  Morbid obesity (HCC) Reevaluate after the first of the year and improve hypertension  Encounter to establish care  Mixed hyperlipidemia -     Lipid panel; Future  Diabetes mellitus of other type without complication, unspecified whether long term insulin  use (HCC) -     Hemoglobin A1c; Future     The above assessment and management plan was discussed with the patient. The patient verbalized understanding of and has agreed to the management plan. Patient is aware to call the clinic if symptoms fail to improve or worsen. Patient is aware when to return to the clinic for a follow-up visit. Patient educated on when it is appropriate to go to the emergency department.   Rosaline Bohr, NP-C

## 2023-12-21 ENCOUNTER — Other Ambulatory Visit (HOSPITAL_COMMUNITY): Payer: Self-pay

## 2023-12-23 ENCOUNTER — Encounter (HOSPITAL_COMMUNITY): Payer: Self-pay | Admitting: *Deleted

## 2023-12-23 ENCOUNTER — Ambulatory Visit (HOSPITAL_COMMUNITY)
Admission: EM | Admit: 2023-12-23 | Discharge: 2023-12-23 | Disposition: A | Discharge status: Home / Self Care | Attending: Internal Medicine | Admitting: Internal Medicine

## 2023-12-23 ENCOUNTER — Other Ambulatory Visit: Payer: Self-pay

## 2023-12-23 DIAGNOSIS — R0789 Other chest pain: Secondary | ICD-10-CM

## 2023-12-23 DIAGNOSIS — R1013 Epigastric pain: Secondary | ICD-10-CM

## 2023-12-23 MED ORDER — OMEPRAZOLE 20 MG PO CPDR: 20.0000 mg | DELAYED_RELEASE_CAPSULE | Freq: Every day | ORAL | 0 refills | Status: AC

## 2023-12-23 MED ORDER — BACLOFEN 10 MG PO TABS: 10.0000 mg | ORAL_TABLET | Freq: Three times a day (TID) | ORAL | 0 refills | Status: AC

## 2023-12-23 NOTE — ED Provider Notes (Signed)
 [facility]    CSN: [id number] Arrival date & time: 12/23/23  9164      History   Chief Complaint Chief Complaint  Patient presents with   Abdominal Pain    HPI Sierra King is a 48 y.o. female.   Sierra King is a 48 y.o. female presenting for chief complaint of epigastric abdominal pain and left upper chest pain that started approximately 2 weeks ago. Pain comes and goes, is worse with movement, and is described as sharp in nature when it happens. At its worst, pain to the epigastrium is an 8/10. Pain to the epigastrium is described as a burning sensation that is worse when she lays down. She frequently eats dinner and lays down to sleep quickly without much time sitting up. She has not eaten spicy/acidic foods recently and denies alcohol intake/drug use. Never smoker. Denies nausea, vomiting, diarrhea, constipation, blood in output, urinary symptoms, flank pain, headaches, fever/chills, dizziness, and rash. Denies acid reflux/burning sensation into the upper chest.  Left upper chest pain feels like an intermittent sharp tingling sensation that happens when she holds her arms out in front of her for long periods of time. 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 She also just got a new stool 2 weeks ago which she believes has changed her posture and positioning and has contributed to her pain. She denies paresthesias to the extremities, radicular pain to the left arm, rash to overlying skin, neck pain, unilateral extremity weakness, heart palpitations, shortness of breath, pleuritic pain, and history of similar symptoms. Rest makes pain better. She has not attempted use of OTC meds for pain PTA.    Abdominal Pain   Past Medical History:  Diagnosis Date   Allergy    Anemia    Arthritis    Diabetes mellitus without complication (HCC)    Fibroids    [place] 08/07/2016   Hypertension     Patient Active Problem  List   Diagnosis Date Noted   HLD (hyperlipidemia) 11/21/2021   Obesity 11/21/2021   Diabetes mellitus (HCC) 10/25/2021   Healthcare maintenance 10/25/2021   Primary osteoarthritis of both knees 06/20/2021   Impingement syndrome, shoulder, right 06/20/2021   Essential hypertension 11/27/2018   Fibroids 08/07/2016    Past Surgical History:  Procedure Laterality Date   BREAST LUMPECTOMY Right    CESAREAN SECTION     IR ANGIOGRAM PELVIS SELECTIVE OR SUPRASELECTIVE  08/07/2016   IR ANGIOGRAM PELVIS SELECTIVE OR SUPRASELECTIVE  08/07/2016   IR ANGIOGRAM SELECTIVE EACH ADDITIONAL VESSEL  08/07/2016   IR ANGIOGRAM SELECTIVE EACH ADDITIONAL VESSEL  08/07/2016   IR EMBO TUMOR ORGAN ISCHEMIA INFARCT INC GUIDE ROADMAPPING  08/07/2016   IR RADIOLOGIST EVAL & MGMT  05/16/2016   IR RADIOLOGIST EVAL & MGMT  09/12/2016   IR RADIOLOGIST EVAL & MGMT  02/27/2017   IR US  GUIDE VASC ACCESS LEFT  08/07/2016   IR US  GUIDE VASC ACCESS RIGHT  08/07/2016   UTERINE ARTERY EMBOLIZATION  08/07/2016   WISDOM TOOTH EXTRACTION      OB History   No obstetric history on file.      Home Medications    Prior to Admission medications   Medication Sig Start Date End Date Taking? Authorizing Provider  amLODipine  (NORVASC ) 10 MG tablet Take 1 tablet (10 mg total) by mouth daily. 12/20/23  Yes Celestia Rosaline SQUIBB, NP  baclofen  (LIORESAL ) 10 MG tablet Take 1  tablet (10 mg total) by mouth 3 (three) times daily. 12/23/23  Yes Enedelia Dorna HERO, FNP  omeprazole  (PRILOSEC) 20 MG capsule Take 1 capsule (20 mg total) by mouth daily. 12/23/23 02/06/24 Yes Kyrah Schiro, Dorna HERO, FNP  valsartan -hydrochlorothiazide  (DIOVAN -HCT) 160-25 MG tablet Take 1 tablet by mouth daily. 12/20/23   Celestia Rosaline SQUIBB, NP    Family History Family History  Problem Relation Age of Onset   Diabetes Mother    Hypertension Mother    Diabetes Father    Hypertension Father    Colon cancer Neg Hx    Colon polyps Neg Hx    Esophageal cancer Neg Hx     Rectal cancer Neg Hx    Stomach cancer Neg Hx     Social History Social History   Tobacco Use   Smoking status: Never    Passive exposure: Never   Smokeless tobacco: Never  Vaping Use   Vaping status: Never Used  Substance Use Topics   Alcohol use: Yes    Comment: occasional glass of wine or beer   Drug use: Yes    Types: Marijuana    Comment: OCASSIONAL- been a while     Allergies   Latex   Review of Systems Review of Systems  Gastrointestinal:  Positive for abdominal pain.  Per HPI   Physical Exam Triage Vital Signs ED Triage Vitals  Encounter Vitals Group     BP 12/23/23 0909 (!) 144/86     Girls Systolic BP Percentile --      Girls Diastolic BP Percentile --      Boys Systolic BP Percentile --      Boys Diastolic BP Percentile --      Pulse Rate 12/23/23 0909 82     Resp 12/23/23 0909 18     Temp 12/23/23 0909 98.9 F (37.2 C)     Temp src --      SpO2 12/23/23 0909 95 %     Weight --      Height --      Head Circumference --      Peak Flow --      Pain Score 12/23/23 0905 2     Pain Loc --      Pain Education --      Exclude from Growth Chart --    No data found.  Updated Vital Signs BP (!) 144/86   Pulse 82   Temp 98.9 F (37.2 C)   Resp 18   LMP 12/23/2023   SpO2 95%   Visual Acuity Right Eye Distance:   Left Eye Distance:   Bilateral Distance:    Right Eye Near:   Left Eye Near:    Bilateral Near:     Physical Exam Vitals and nursing note reviewed.  Constitutional:      Appearance: She is not ill-appearing or toxic-appearing.  HENT:     Head: Normocephalic and atraumatic.     Right Ear: Hearing and external ear normal.     Left Ear: Hearing and external ear normal.     Nose: Nose normal.     Mouth/Throat:     Lips: Pink.  Eyes:     General: Lids are normal. Vision grossly intact. Gaze aligned appropriately.     Extraocular Movements: Extraocular movements intact.     Conjunctiva/sclera: Conjunctivae normal.   Cardiovascular:     Rate and Rhythm: Normal rate and regular rhythm.     Heart sounds: Normal heart sounds, S1 normal and  S2 normal.  Pulmonary:     Effort: Pulmonary effort is normal. No respiratory distress.     Breath sounds: Normal breath sounds and air entry.  Chest:    Abdominal:     General: Abdomen is flat. Bowel sounds are normal.     Palpations: Abdomen is soft.     Tenderness: There is abdominal tenderness in the epigastric area. There is no right CVA tenderness, left CVA tenderness or guarding.  Musculoskeletal:     Right shoulder: Normal.     Left shoulder: Normal.     Cervical back: Neck supple.  Skin:    General: Skin is warm and dry.     Capillary Refill: Capillary refill takes less than 2 seconds.     Findings: No rash.  Neurological:     General: No focal deficit present.     Mental Status: She is alert and oriented to person, place, and time. Mental status is at baseline.     GCS: GCS eye subscore is 4. GCS verbal subscore is 5. GCS motor subscore is 6.     Cranial Nerves: Cranial nerves 2-12 are intact. No dysarthria.     Sensory: Sensation is intact.     Motor: Motor function is intact. No weakness or tremor.     Coordination: Coordination is intact.     Gait: Gait is intact.     Comments: Strength and sensation intact to bilateral upper and lower extremities (5/5). Moves all 4 extremities with normal coordination voluntarily. Non-focal neuro exam.   Psychiatric:        Mood and Affect: Mood normal.        Speech: Speech normal.        Behavior: Behavior normal.        Thought Content: Thought content normal.        Judgment: Judgment normal.      UC Treatments / Results  Labs (all labs ordered are listed, but only abnormal results are displayed) Labs Reviewed - No data to display  EKG   Radiology No results found.  Procedures Procedures (including critical care time)  Medications Ordered in UC Medications - No data to display  Initial  Impression / Assessment and Plan / UC Course  I have reviewed the triage vital signs and the nursing notes.  Pertinent labs & imaging results that were available during my care of the patient were reviewed by me and considered in my medical decision making (see chart for details).   1. Muscular chest pain, epigastric abdominal pain Emergent cause to patient's chest pain ruled out today based on history, and physical exam.  Pericarditis, chest wall tenderness, acute MI, infectious process, acid reflux, and PE considered. Patient is low risk for acute coronary event based on risk factors.   Deferred imaging given clear cardiopulmonary exam, hemodynamically stable vital signs.  Pain is reproducible to palpation and there is high clinical suspicion for MSK etiology of pain given origin of pain, therefore EKG was not performed today.   Will manage this as a chest wall strain with tylenol , heat/gentle ROM exercises, and muscle relaxer as needed (drowsiness precautions discussed) to treat for musculoskeletal etiology.  Recommend follow-up with PCP in 3-5 days and ER precautions for new or worsening symptoms.    Epigastric pain likely secondary to mild gastritis.  Trial omeprazole  20mg  daily for 6 weeks.  GERD/gastritis dietary precautions discussed, lifestyle/behavioral changes discussed.  No peritoneal signs on abdominal exam, low suspicion for cholecystitis, pancreatitis.  Counseled patient on potential for adverse effects with medications prescribed/recommended today, strict ER and return-to-clinic precautions discussed, patient verbalized understanding.    Final Clinical Impressions(s) / UC Diagnoses   Final diagnoses:  Muscular chest pain  Abdominal pain, epigastric     Discharge Instructions      It is unclear what is causing your chest discomfort and your evaluation has shown no signs of medical conditions requiring emergent intervention at this time. This is very reassuring! Your  discomfort may be related to pain in the muscles and bones in your chest due to your job.   You may use over the counter tylenol  as needed for pain.  Apply heat to the chest wall 20 minutes on 20 minutes off and perform gentle stretches to the area. You may take muscle relaxer as prescribed but do not drink alcohol or drive while taking this since it can make you sleepy. Mainly take this at nighttime to help with symptoms.  Epigastric or upper abdominal pain is likely caused by gastritis which is inflammation of the stomach lining. Take omeprazole  20 mg once daily for the next 6 weeks in the morning. Refer to list of foods to avoid attached in your discharge packet to help with burning pain to the epigastrium.  Follow-up with PCP. Avoid laying flat for at least 2 hours after eating.  Avoid going for long periods of time without eating.  If your symptoms do not improve in the next 2-3 days with interventions, please return.. Return to the Emergency Department if you experience worsening or uncontrolled chest pain, shortness of breath, light headedness, feeling faint, nausea, vomiting, or any other concerning symptoms. Otherwise, schedule a follow-up appointment with your PCP for follow-up.      ED Prescriptions     Medication Sig Dispense Auth. Provider   baclofen  (LIORESAL ) 10 MG tablet Take 1 tablet (10 mg total) by mouth 3 (three) times daily. 30 each Enedelia Dorna HERO, FNP   omeprazole  (PRILOSEC) 20 MG capsule Take 1 capsule (20 mg total) by mouth daily. 45 capsule Enedelia Dorna HERO, FNP      PDMP not reviewed this encounter.   Enedelia Dorna HERO, [place] 12/23/23 1021

## 2023-12-23 NOTE — Discharge Instructions (Signed)
 It is unclear what is causing your chest discomfort and your evaluation has shown no signs of medical conditions requiring emergent intervention at this time. This is very reassuring! Your discomfort may be related to pain in the muscles and bones in your chest due to your job.   You may use over the counter tylenol  as needed for pain.  Apply heat to the chest wall 20 minutes on 20 minutes off and perform gentle stretches to the area. You may take muscle relaxer as prescribed but do not drink alcohol or drive while taking this since it can make you sleepy. Mainly take this at nighttime to help with symptoms.  Epigastric or upper abdominal pain is likely caused by gastritis which is inflammation of the stomach lining. Take omeprazole  20 mg once daily for the next 6 weeks in the morning. Refer to list of foods to avoid attached in your discharge packet to help with burning pain to the epigastrium.  Follow-up with PCP. Avoid laying flat for at least 2 hours after eating.  Avoid going for long periods of time without eating.  If your symptoms do not improve in the next 2-3 days with interventions, please return.. Return to the Emergency Department if you experience worsening or uncontrolled chest pain, shortness of breath, light headedness, feeling faint, nausea, vomiting, or any other concerning symptoms. Otherwise, schedule a follow-up appointment with your PCP for follow-up.

## 2023-12-23 NOTE — ED Triage Notes (Addendum)
 PT reports upper ABD pain and CP off and on for2 weeks . Pt reports pain worse when she wakes in AM and with movement. Pt also has tingling on Lt side of chest. Pt tried otc for pain with out relief.  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

## 2024-01-02 ENCOUNTER — Other Ambulatory Visit (HOSPITAL_COMMUNITY): Payer: Self-pay

## 2024-01-03 ENCOUNTER — Telehealth: Payer: Self-pay

## 2024-01-03 NOTE — Telephone Encounter (Signed)
 Copied from CRM #[id number]. Topic: General - Billing Inquiry >> Jan 03, 2024  3:49 PM Anairis L wrote: Reason for CRM: Patient would like to if Dr.Edwards is in network with [employer] [facility] or [facility], [employer] home,[place], [employer].

## 2024-01-04 NOTE — Telephone Encounter (Signed)
Called pt and did not get an answer.

## 2024-01-04 NOTE — Telephone Encounter (Signed)
 Kellen could you take care of this

## 2024-01-21 ENCOUNTER — Encounter: Payer: Self-pay | Admitting: *Deleted

## 2024-01-21 NOTE — Progress Notes (Signed)
 Edlyn Rosenburg                                          MRN: [id number]   01/21/2024   The [facility] Quality Team Specialist reviewed this patient medical record for the purposes of chart review for care gap closure. The following were reviewed: chart review for care gap closure-glycemic status assessment and kidney health evaluation for diabetes:eGFR  and uACR.    [facility] Quality Team

## 2024-02-01 NOTE — Progress Notes (Signed)
 Sussan Meter                                          MRN: [id number]   02/01/2024   The [facility] Quality Team Specialist reviewed this patient medical record for the purposes of chart review for care gap closure. The following were reviewed: chart review for care gap closure-controlling blood pressure.    [facility] Quality Team

## 2024-02-04 ENCOUNTER — Ambulatory Visit (INDEPENDENT_AMBULATORY_CARE_PROVIDER_SITE_OTHER): Admitting: Primary Care

## 2024-02-18 NOTE — Progress Notes (Addendum)
 Pati Thinnes                                          MRN: [id number]   02/18/2024   The [facility] Quality Team Specialist reviewed this patient medical record for the purposes of chart review for care gap closure. The following were reviewed: chart review for care gap closure-glycemic status assessment and kidney health evaluation for diabetes:eGFR  and uACR.    [facility] Quality Team

## 2024-03-03 ENCOUNTER — Encounter (HOSPITAL_BASED_OUTPATIENT_CLINIC_OR_DEPARTMENT_OTHER): Payer: Self-pay
